# Patient Record
Sex: Female | Born: 1980 | Race: White | Hispanic: No | Marital: Married | Smoking: Never smoker
Health system: Southern US, Community
[De-identification: ages and names within clinical notes are randomized; demographics above are authoritative.]

## PROBLEM LIST (undated history)

## (undated) DIAGNOSIS — G43909 Migraine, unspecified, not intractable, without status migrainosus: Secondary | ICD-10-CM

## (undated) HISTORY — PX: WISDOM TOOTH EXTRACTION: SHX21

## (undated) HISTORY — PX: UPPER GI ENDOSCOPY: SHX6162

## (undated) HISTORY — PX: TONSILLECTOMY AND ADENOIDECTOMY: SHX28

## (undated) HISTORY — PX: DILATION AND CURETTAGE OF UTERUS: SHX78

## (undated) HISTORY — PX: TONSILLECTOMY: SUR1361

## (undated) HISTORY — PX: LASIK: SHX215

## (undated) HISTORY — PX: OTHER SURGICAL HISTORY: SHX169

---

## 2016-12-27 LAB — OB RESULTS CONSOLE RUBELLA ANTIBODY, IGM: Rubella: IMMUNE

## 2016-12-27 LAB — OB RESULTS CONSOLE RPR: RPR: NONREACTIVE

## 2016-12-27 LAB — OB RESULTS CONSOLE GC/CHLAMYDIA
Chlamydia: NEGATIVE
Gonorrhea: NEGATIVE

## 2016-12-27 LAB — OB RESULTS CONSOLE HEPATITIS B SURFACE ANTIGEN: HEP B S AG: NEGATIVE

## 2016-12-27 LAB — OB RESULTS CONSOLE HIV ANTIBODY (ROUTINE TESTING): HIV: NONREACTIVE

## 2016-12-27 LAB — OB RESULTS CONSOLE ABO/RH: RH TYPE: POSITIVE

## 2016-12-27 LAB — OB RESULTS CONSOLE ANTIBODY SCREEN: ANTIBODY SCREEN: NEGATIVE

## 2017-02-23 ENCOUNTER — Other Ambulatory Visit (HOSPITAL_COMMUNITY): Payer: Self-pay | Admitting: Obstetrics and Gynecology

## 2017-02-23 DIAGNOSIS — Z3A23 23 weeks gestation of pregnancy: Secondary | ICD-10-CM

## 2017-02-23 DIAGNOSIS — O09522 Supervision of elderly multigravida, second trimester: Secondary | ICD-10-CM

## 2017-03-01 ENCOUNTER — Encounter (HOSPITAL_COMMUNITY): Payer: Self-pay | Admitting: *Deleted

## 2017-03-03 ENCOUNTER — Ambulatory Visit (HOSPITAL_COMMUNITY)
Admission: RE | Admit: 2017-03-03 | Discharge: 2017-03-03 | Disposition: A | Discharge status: Home / Self Care | Payer: PRIVATE HEALTH INSURANCE | Source: Ambulatory Visit | Attending: Obstetrics and Gynecology | Admitting: Obstetrics and Gynecology

## 2017-03-03 ENCOUNTER — Encounter (HOSPITAL_COMMUNITY): Payer: Self-pay

## 2017-03-03 ENCOUNTER — Other Ambulatory Visit (HOSPITAL_COMMUNITY): Payer: Self-pay | Admitting: Obstetrics and Gynecology

## 2017-03-03 DIAGNOSIS — Z3A22 22 weeks gestation of pregnancy: Secondary | ICD-10-CM | POA: Diagnosis not present

## 2017-03-03 DIAGNOSIS — O35EXX0 Maternal care for other (suspected) fetal abnormality and damage, fetal genitourinary anomalies, not applicable or unspecified: Secondary | ICD-10-CM

## 2017-03-03 DIAGNOSIS — O09522 Supervision of elderly multigravida, second trimester: Secondary | ICD-10-CM

## 2017-03-03 DIAGNOSIS — Z369 Encounter for antenatal screening, unspecified: Secondary | ICD-10-CM

## 2017-03-03 DIAGNOSIS — Z363 Encounter for antenatal screening for malformations: Secondary | ICD-10-CM | POA: Insufficient documentation

## 2017-03-03 DIAGNOSIS — O358XX0 Maternal care for other (suspected) fetal abnormality and damage, not applicable or unspecified: Secondary | ICD-10-CM

## 2017-03-03 DIAGNOSIS — O09512 Supervision of elderly primigravida, second trimester: Secondary | ICD-10-CM | POA: Insufficient documentation

## 2017-03-03 DIAGNOSIS — Z3A23 23 weeks gestation of pregnancy: Secondary | ICD-10-CM

## 2017-03-03 DIAGNOSIS — O283 Abnormal ultrasonic finding on antenatal screening of mother: Secondary | ICD-10-CM | POA: Diagnosis not present

## 2017-03-03 HISTORY — DX: Migraine, unspecified, not intractable, without status migrainosus: G43.909

## 2017-03-06 ENCOUNTER — Other Ambulatory Visit (HOSPITAL_COMMUNITY): Payer: Self-pay | Admitting: *Deleted

## 2017-03-06 DIAGNOSIS — Q639 Congenital malformation of kidney, unspecified: Secondary | ICD-10-CM

## 2017-03-07 ENCOUNTER — Encounter (HOSPITAL_COMMUNITY): Payer: Self-pay

## 2017-03-08 ENCOUNTER — Encounter (HOSPITAL_COMMUNITY): Payer: Self-pay

## 2017-03-30 ENCOUNTER — Encounter (HOSPITAL_COMMUNITY): Payer: Self-pay

## 2017-03-30 ENCOUNTER — Other Ambulatory Visit (HOSPITAL_COMMUNITY): Payer: Self-pay | Admitting: *Deleted

## 2017-03-30 ENCOUNTER — Ambulatory Visit (HOSPITAL_COMMUNITY)
Admission: RE | Admit: 2017-03-30 | Discharge: 2017-03-30 | Disposition: A | Discharge status: Home / Self Care | Payer: PRIVATE HEALTH INSURANCE | Source: Ambulatory Visit | Attending: Obstetrics and Gynecology | Admitting: Obstetrics and Gynecology

## 2017-03-30 ENCOUNTER — Other Ambulatory Visit (HOSPITAL_COMMUNITY): Payer: Self-pay | Admitting: Obstetrics and Gynecology

## 2017-03-30 DIAGNOSIS — Z3A26 26 weeks gestation of pregnancy: Secondary | ICD-10-CM | POA: Insufficient documentation

## 2017-03-30 DIAGNOSIS — O358XX0 Maternal care for other (suspected) fetal abnormality and damage, not applicable or unspecified: Secondary | ICD-10-CM

## 2017-03-30 DIAGNOSIS — O09522 Supervision of elderly multigravida, second trimester: Secondary | ICD-10-CM

## 2017-03-30 DIAGNOSIS — Q639 Congenital malformation of kidney, unspecified: Secondary | ICD-10-CM

## 2017-03-30 DIAGNOSIS — O35EXX0 Maternal care for other (suspected) fetal abnormality and damage, fetal genitourinary anomalies, not applicable or unspecified: Secondary | ICD-10-CM

## 2017-03-30 DIAGNOSIS — O09512 Supervision of elderly primigravida, second trimester: Secondary | ICD-10-CM | POA: Insufficient documentation

## 2017-03-30 DIAGNOSIS — O283 Abnormal ultrasonic finding on antenatal screening of mother: Secondary | ICD-10-CM | POA: Insufficient documentation

## 2017-03-30 NOTE — Addendum Note (Signed)
Encounter addended by: Lenise ArenaBazemore, Shakeera Rightmyer, RDMS on: 03/30/2017  9:42 AM<BR>    Actions taken: Imaging Exam ended

## 2017-03-31 ENCOUNTER — Ambulatory Visit (HOSPITAL_COMMUNITY): Payer: PRIVATE HEALTH INSURANCE

## 2017-04-26 ENCOUNTER — Ambulatory Visit (HOSPITAL_COMMUNITY): Payer: PRIVATE HEALTH INSURANCE

## 2017-05-01 ENCOUNTER — Ambulatory Visit (HOSPITAL_COMMUNITY)
Admission: RE | Admit: 2017-05-01 | Discharge: 2017-05-01 | Disposition: A | Discharge status: Home / Self Care | Payer: PRIVATE HEALTH INSURANCE | Source: Ambulatory Visit | Attending: Obstetrics and Gynecology | Admitting: Obstetrics and Gynecology

## 2017-05-01 ENCOUNTER — Encounter (HOSPITAL_COMMUNITY): Payer: Self-pay

## 2017-05-01 ENCOUNTER — Other Ambulatory Visit (HOSPITAL_COMMUNITY): Payer: Self-pay | Admitting: Obstetrics and Gynecology

## 2017-05-01 DIAGNOSIS — O35EXX0 Maternal care for other (suspected) fetal abnormality and damage, fetal genitourinary anomalies, not applicable or unspecified: Secondary | ICD-10-CM

## 2017-05-01 DIAGNOSIS — O9989 Other specified diseases and conditions complicating pregnancy, childbirth and the puerperium: Secondary | ICD-10-CM | POA: Insufficient documentation

## 2017-05-01 DIAGNOSIS — O09513 Supervision of elderly primigravida, third trimester: Secondary | ICD-10-CM | POA: Insufficient documentation

## 2017-05-01 DIAGNOSIS — Z3A30 30 weeks gestation of pregnancy: Secondary | ICD-10-CM | POA: Diagnosis not present

## 2017-05-01 DIAGNOSIS — O283 Abnormal ultrasonic finding on antenatal screening of mother: Secondary | ICD-10-CM | POA: Diagnosis not present

## 2017-05-01 DIAGNOSIS — O358XX0 Maternal care for other (suspected) fetal abnormality and damage, not applicable or unspecified: Secondary | ICD-10-CM

## 2017-05-01 DIAGNOSIS — N1330 Unspecified hydronephrosis: Secondary | ICD-10-CM | POA: Insufficient documentation

## 2017-05-15 ENCOUNTER — Other Ambulatory Visit (HOSPITAL_COMMUNITY): Payer: Self-pay | Admitting: *Deleted

## 2017-05-15 DIAGNOSIS — O359XX0 Maternal care for (suspected) fetal abnormality and damage, unspecified, not applicable or unspecified: Secondary | ICD-10-CM

## 2017-05-29 ENCOUNTER — Ambulatory Visit (HOSPITAL_COMMUNITY): Payer: PRIVATE HEALTH INSURANCE

## 2017-06-01 ENCOUNTER — Encounter (HOSPITAL_COMMUNITY): Payer: Self-pay

## 2017-06-01 ENCOUNTER — Ambulatory Visit (HOSPITAL_COMMUNITY)
Admission: RE | Admit: 2017-06-01 | Discharge: 2017-06-01 | Disposition: A | Discharge status: Home / Self Care | Payer: PRIVATE HEALTH INSURANCE | Source: Ambulatory Visit | Attending: Obstetrics and Gynecology | Admitting: Obstetrics and Gynecology

## 2017-06-01 DIAGNOSIS — O359XX0 Maternal care for (suspected) fetal abnormality and damage, unspecified, not applicable or unspecified: Secondary | ICD-10-CM | POA: Diagnosis present

## 2017-06-01 DIAGNOSIS — Z3A35 35 weeks gestation of pregnancy: Secondary | ICD-10-CM | POA: Insufficient documentation

## 2017-06-02 LAB — OB RESULTS CONSOLE GBS: STREP GROUP B AG: NEGATIVE

## 2017-06-13 NOTE — L&D Delivery Note (Signed)
Operative Delivery Note At 8:33 AM a viable and healthy female was delivered via Vaginal, Vacuum Investment banker, operational(Extractor).  Presentation: vertex; Position: Left,, Occiput,, Anterior; Station: +3.  Verbal consent: obtained from patient. Indicated due to prolonged fetal bradycardia. Risks and benefits discussed in detail.  Risks include, but are not limited to the risks of anesthesia, bleeding, infection, damage to maternal tissues, fetal cephalhematoma.  There is also the risk of inability to effect vaginal delivery of the head, or shoulder dystocia that cannot be resolved by established maneuvers, leading to the need for emergency cesarean section.  APGAR: 8, 9; weight 7 lb 1.2 oz (3209 g).   Placenta status: spontaneous, intact.   Cord: spontaneous with the following complications: none.  Cord pH: na  Anesthesia:  local Instruments: Kiwi x 3 pulls, 2 pop offs Episiotomy: Right Mediolateral Lacerations: 3rd degree Suture Repair: 2.0 3.0 vicryl rapide and 0 vicryl Est. Blood Loss (mL): 300  Mom to postpartum.  Baby to Couplet care / Skin to Skin.  Nadiyah Zeis J 07/06/2017, 1:06 PM

## 2017-07-05 ENCOUNTER — Encounter (HOSPITAL_COMMUNITY): Payer: Self-pay | Admitting: *Deleted

## 2017-07-05 ENCOUNTER — Telehealth (HOSPITAL_COMMUNITY): Payer: Self-pay | Admitting: *Deleted

## 2017-07-05 ENCOUNTER — Other Ambulatory Visit: Payer: Self-pay | Admitting: Obstetrics and Gynecology

## 2017-07-05 NOTE — Telephone Encounter (Signed)
Preadmission screen  

## 2017-07-06 ENCOUNTER — Other Ambulatory Visit: Payer: Self-pay

## 2017-07-06 ENCOUNTER — Encounter (HOSPITAL_COMMUNITY): Payer: Self-pay

## 2017-07-06 ENCOUNTER — Inpatient Hospital Stay (HOSPITAL_COMMUNITY)
Admission: AD | Admit: 2017-07-06 | Discharge: 2017-07-08 | DRG: 768 | Disposition: A | Discharge status: Home / Self Care | Payer: PRIVATE HEALTH INSURANCE | Source: Ambulatory Visit | Attending: Obstetrics and Gynecology | Admitting: Obstetrics and Gynecology

## 2017-07-06 DIAGNOSIS — Z3A40 40 weeks gestation of pregnancy: Secondary | ICD-10-CM

## 2017-07-06 DIAGNOSIS — O2243 Hemorrhoids in pregnancy, third trimester: Secondary | ICD-10-CM | POA: Diagnosis present

## 2017-07-06 DIAGNOSIS — Z3483 Encounter for supervision of other normal pregnancy, third trimester: Secondary | ICD-10-CM | POA: Diagnosis present

## 2017-07-06 DIAGNOSIS — Z880 Allergy status to penicillin: Secondary | ICD-10-CM | POA: Diagnosis not present

## 2017-07-06 DIAGNOSIS — O358XX0 Maternal care for other (suspected) fetal abnormality and damage, not applicable or unspecified: Secondary | ICD-10-CM | POA: Diagnosis present

## 2017-07-06 DIAGNOSIS — Z8759 Personal history of other complications of pregnancy, childbirth and the puerperium: Secondary | ICD-10-CM

## 2017-07-06 LAB — CBC
HCT: 45 % (ref 36.0–46.0)
Hemoglobin: 15.7 g/dL — ABNORMAL HIGH (ref 12.0–15.0)
MCH: 32.7 pg (ref 26.0–34.0)
MCHC: 34.9 g/dL (ref 30.0–36.0)
MCV: 93.8 fL (ref 78.0–100.0)
PLATELETS: 190 10*3/uL (ref 150–400)
RBC: 4.8 MIL/uL (ref 3.87–5.11)
RDW: 14 % (ref 11.5–15.5)
WBC: 13.5 10*3/uL — ABNORMAL HIGH (ref 4.0–10.5)

## 2017-07-06 LAB — TYPE AND SCREEN
ABO/RH(D): O POS
ANTIBODY SCREEN: NEGATIVE

## 2017-07-06 LAB — ABO/RH: ABO/RH(D): O POS

## 2017-07-06 MED ORDER — OXYTOCIN 40 UNITS IN LACTATED RINGERS INFUSION - SIMPLE MED
INTRAVENOUS | Status: AC
  Filled 2017-07-06: qty 1000

## 2017-07-06 MED ORDER — OXYCODONE HCL 5 MG PO TABS: 5.0000 mg | ORAL_TABLET | ORAL | Status: DC | PRN

## 2017-07-06 MED ORDER — OXYCODONE-ACETAMINOPHEN 5-325 MG PO TABS: 1.0000 | ORAL_TABLET | ORAL | Status: DC | PRN

## 2017-07-06 MED ORDER — ACETAMINOPHEN 325 MG PO TABS
650.0000 mg | ORAL_TABLET | ORAL | Status: DC | PRN
  Administered 2017-07-06: 650 mg via ORAL
  Filled 2017-07-06: qty 2

## 2017-07-06 MED ORDER — LACTATED RINGERS IV SOLN
INTRAVENOUS | Status: DC
  Administered 2017-07-06: 08:00:00 via INTRAVENOUS

## 2017-07-06 MED ORDER — LACTATED RINGERS IV SOLN: 500.0000 mL | INTRAVENOUS | Status: DC | PRN

## 2017-07-06 MED ORDER — DIBUCAINE 1 % RE OINT: 1.0000 "application " | TOPICAL_OINTMENT | RECTAL | Status: DC | PRN

## 2017-07-06 MED ORDER — LIDOCAINE HCL (PF) 1 % IJ SOLN
30.0000 mL | INTRAMUSCULAR | Status: AC | PRN
  Administered 2017-07-06: 30 mL via SUBCUTANEOUS
  Filled 2017-07-06: qty 30

## 2017-07-06 MED ORDER — METHYLERGONOVINE MALEATE 0.2 MG/ML IJ SOLN
0.2000 mg | Freq: Once | INTRAMUSCULAR | Status: AC
  Administered 2017-07-06: 0.2 mg via INTRAMUSCULAR

## 2017-07-06 MED ORDER — FLEET ENEMA 7-19 GM/118ML RE ENEM: 1.0000 | ENEMA | RECTAL | Status: DC | PRN

## 2017-07-06 MED ORDER — OXYTOCIN 40 UNITS IN LACTATED RINGERS INFUSION - SIMPLE MED
2.5000 [IU]/h | INTRAVENOUS | Status: DC
  Administered 2017-07-06: 2.5 [IU]/h via INTRAVENOUS

## 2017-07-06 MED ORDER — METHYLERGONOVINE MALEATE 0.2 MG PO TABS: 0.2000 mg | ORAL_TABLET | ORAL | Status: DC | PRN

## 2017-07-06 MED ORDER — SOD CITRATE-CITRIC ACID 500-334 MG/5ML PO SOLN: 30.0000 mL | ORAL | Status: DC | PRN

## 2017-07-06 MED ORDER — METHYLERGONOVINE MALEATE 0.2 MG/ML IJ SOLN: 0.2000 mg | INTRAMUSCULAR | Status: DC | PRN

## 2017-07-06 MED ORDER — WITCH HAZEL-GLYCERIN EX PADS: 1.0000 "application " | MEDICATED_PAD | CUTANEOUS | Status: DC | PRN

## 2017-07-06 MED ORDER — LIDOCAINE HCL (PF) 1 % IJ SOLN
INTRAMUSCULAR | Status: AC
  Filled 2017-07-06: qty 30

## 2017-07-06 MED ORDER — SENNOSIDES-DOCUSATE SODIUM 8.6-50 MG PO TABS
2.0000 | ORAL_TABLET | ORAL | Status: DC
  Administered 2017-07-07 (×2): 2 via ORAL
  Filled 2017-07-06 (×2): qty 2

## 2017-07-06 MED ORDER — HYDROCORTISONE ACE-PRAMOXINE 1-1 % RE FOAM
1.0000 | Freq: Two times a day (BID) | RECTAL | Status: DC
  Administered 2017-07-06 – 2017-07-08 (×5): 1 via RECTAL
  Filled 2017-07-06 (×2): qty 10

## 2017-07-06 MED ORDER — ONDANSETRON HCL 4 MG/2ML IJ SOLN: 4.0000 mg | Freq: Four times a day (QID) | INTRAMUSCULAR | Status: DC | PRN

## 2017-07-06 MED ORDER — SIMETHICONE 80 MG PO CHEW: 80.0000 mg | CHEWABLE_TABLET | ORAL | Status: DC | PRN

## 2017-07-06 MED ORDER — OXYCODONE-ACETAMINOPHEN 5-325 MG PO TABS: 2.0000 | ORAL_TABLET | ORAL | Status: DC | PRN

## 2017-07-06 MED ORDER — ACETAMINOPHEN 325 MG PO TABS: 650.0000 mg | ORAL_TABLET | ORAL | Status: DC | PRN

## 2017-07-06 MED ORDER — COCONUT OIL OIL: 1.0000 "application " | TOPICAL_OIL | Status: DC | PRN

## 2017-07-06 MED ORDER — BENZOCAINE-MENTHOL 20-0.5 % EX AERO
1.0000 "application " | INHALATION_SPRAY | CUTANEOUS | Status: DC | PRN
  Administered 2017-07-06: 1 via TOPICAL
  Filled 2017-07-06: qty 56

## 2017-07-06 MED ORDER — MAGNESIUM OXIDE 400 (241.3 MG) MG PO TABS
400.0000 mg | ORAL_TABLET | Freq: Every day | ORAL | Status: DC
  Administered 2017-07-06 – 2017-07-08 (×3): 400 mg via ORAL
  Filled 2017-07-06 (×3): qty 1

## 2017-07-06 MED ORDER — DOCUSATE SODIUM 100 MG PO CAPS
100.0000 mg | ORAL_CAPSULE | Freq: Two times a day (BID) | ORAL | Status: DC
  Administered 2017-07-06 – 2017-07-08 (×5): 100 mg via ORAL
  Filled 2017-07-06 (×5): qty 1

## 2017-07-06 MED ORDER — IBUPROFEN 600 MG PO TABS
600.0000 mg | ORAL_TABLET | Freq: Four times a day (QID) | ORAL | Status: DC
  Administered 2017-07-06 – 2017-07-08 (×9): 600 mg via ORAL
  Filled 2017-07-06 (×9): qty 1

## 2017-07-06 MED ORDER — OXYTOCIN BOLUS FROM INFUSION
500.0000 mL | Freq: Once | INTRAVENOUS | Status: AC
  Administered 2017-07-06: 500 mL via INTRAVENOUS

## 2017-07-06 NOTE — H&P (Signed)
Connie Lopez is a 37 y.o. female presenting for labor. OB History    Gravida Para Term Preterm AB Living   2 1 1   1 1    SAB TAB Ectopic Multiple Live Births   1     0 1     Past Medical History:  Diagnosis Date  . Migraines    Past Surgical History:  Procedure Laterality Date  . LASIK    . Lower GI    . TONSILLECTOMY    . TONSILLECTOMY AND ADENOIDECTOMY    . UPPER GI ENDOSCOPY    . WISDOM TOOTH EXTRACTION     Family History: family history includes Colon cancer in her maternal grandmother; Hypertension in her maternal grandfather and maternal grandmother; Lung cancer in her paternal grandfather. Social History:  reports that  has never smoked. she has never used smokeless tobacco. She reports that she does not drink alcohol or use drugs.     Maternal Diabetes: No Genetic Screening: Normal Maternal Ultrasounds/Referrals: Abnormal:  Findings:   Fetal renal pyelectasis Fetal Ultrasounds or other Referrals:  Referred to Materal Fetal Medicine  Maternal Substance Abuse:  No Significant Maternal Medications:  None Significant Maternal Lab Results:  None Other Comments:  peds urology for fu  Review of Systems  Constitutional: Negative.   All other systems reviewed and are negative.  Maternal Medical History:  Reason for admission: Contractions.   Contractions: Onset was 3-5 hours ago.   Frequency: regular.   Perceived severity is moderate.    Fetal activity: Perceived fetal activity is normal.   Last perceived fetal movement was within the past hour.    Prenatal complications: no prenatal complications Prenatal Complications - Diabetes: none.    Dilation: 10 Station: +2, +3 Exam by:: Leeasia Secrist Blood pressure 137/67, pulse 91, temperature 98 F (36.7 C), temperature source Oral, resp. rate 16, height 5\' 7"  (1.702 m), weight 66.7 kg (147 lb), last menstrual period 09/16/2016, SpO2 99 %, unknown if currently breastfeeding. Maternal Exam:  Uterine Assessment: Contraction  strength is firm.  Contraction frequency is regular.   Abdomen: Patient reports no abdominal tenderness. Fetal presentation: vertex  Introitus: Normal vulva. Normal vagina.  Ferning test: not done.  Nitrazine test: not done. Amniotic fluid character: not assessed.  Cervix: Cervix evaluated by digital exam.     Physical Exam  Nursing note and vitals reviewed. Constitutional: She is oriented to person, place, and time. She appears well-developed and well-nourished.  HENT:  Head: Normocephalic.  Neck: Normal range of motion. Neck supple.  Cardiovascular: Normal rate and regular rhythm.  Respiratory: Effort normal and breath sounds normal.  GI: Bowel sounds are normal.  Genitourinary: Vagina normal and uterus normal.  Musculoskeletal: Normal range of motion.  Neurological: She is alert and oriented to person, place, and time. She has normal reflexes.  Skin: Skin is warm and dry.  Psychiatric: She has a normal mood and affect.    Prenatal labs: ABO, Rh: --/--/O POS (01/24 0757) Antibody: NEG (01/24 0757) Rubella: Immune (07/17 0000) RPR: Nonreactive (07/17 0000)  HBsAg: Negative (07/17 0000)  HIV: Non-reactive (07/17 0000)  GBS: Negative (12/21 0000)   Assessment/Plan: 40 wk iup Active labor Fetal Urinary tract dilatation FU peds pp. Admit   Oshay Stranahan J 07/06/2017, 1:03 PM

## 2017-07-07 LAB — CBC
HCT: 34.8 % — ABNORMAL LOW (ref 36.0–46.0)
Hemoglobin: 11.6 g/dL — ABNORMAL LOW (ref 12.0–15.0)
MCH: 31.8 pg (ref 26.0–34.0)
MCHC: 33.3 g/dL (ref 30.0–36.0)
MCV: 95.3 fL (ref 78.0–100.0)
Platelets: 167 10*3/uL (ref 150–400)
RBC: 3.65 MIL/uL — ABNORMAL LOW (ref 3.87–5.11)
RDW: 14.2 % (ref 11.5–15.5)
WBC: 11.3 10*3/uL — ABNORMAL HIGH (ref 4.0–10.5)

## 2017-07-07 LAB — BIRTH TISSUE RECOVERY COLLECTION (PLACENTA DONATION)

## 2017-07-07 LAB — RPR: RPR Ser Ql: NONREACTIVE

## 2017-07-07 NOTE — Lactation Note (Signed)
This note was copied from a baby's chart. Lactation Consultation Note  Patient Name: Connie Charleston RopesSara Wickwire UJWJX'BToday's Date: 07/07/2017 Reason for consult: Follow-up assessment;Infant weight loss(5% weight loss, )  Baby is 32 hours old  LC reviewed and updated the doc flow sheets per mom  Per mom baby last fed at 4:15 pm for 15 mins  Baby presently being held by mom and baby sleeping.  Per mom breast feeding is going well and improving.  Also encouraged hand expressing between feedings for extra calories.  LC instructed mom on the use of hand pump for D/C tomorrow.  Per mom will have A DEBP Medela at home.  LC discussed nutritive vs non - nutritive feeding patterns and to watch for hanging  Out latched.     Maternal Data    Feeding Feeding Type: (per mom baby last fed at 4:15 p for 15 mins ) Length of feed: 15 min  LATCH Score                   Interventions Interventions: Breast feeding basics reviewed  Lactation Tools Discussed/Used Tools: Pump Breast pump type: Manual WIC Program: No Pump Review: Setup, frequency, and cleaning;Milk Storage Initiated by:: MAI  Date initiated:: 07/07/17   Consult Status Consult Status: Follow-up Date: 07/08/17 Follow-up type: In-patient    Matilde SprangMargaret Ann Quanah Majka 07/07/2017, 5:21 PM

## 2017-07-07 NOTE — Progress Notes (Signed)
Post Partum Day 1 S/P vacuum extraction  Feeding: bottle Subjective: No HA, SOB, CP, F/C, breast symptoms. Normal vaginal bleeding, no clots. Pain controlled.  ambulating without symptoms.  voiding without difficulty. Pt bothered by hemorrhoids  Objective: BP (!) 103/51 (BP Location: Right Arm)   Pulse 76   Temp 97.6 F (36.4 C) (Oral)   Resp 18   Ht 5\' 7"  (1.702 m)   Wt 66.7 kg (147 lb)   LMP 09/16/2016   SpO2 98%   Breastfeeding? Unknown   BMI 23.02 kg/m  I&O reviewed.   Physical Exam:  General: alert and cooperative Lochia: appropriate Uterine Fundus: firm DVT Evaluation: No evidence of DVT seen on physical exam. Ext: No c/c/e Recent Labs    07/06/17 0757 07/07/17 0537  HGB 15.7* 11.6*  HCT 45.0 34.8*      Assessment/Plan: 37 y.o.  PPD #1 .  normal postpartum exam Continue current postpartum care Ambulate Plan anusol script with discharge, recc stool softener   LOS: 1 day   Lendon ColonelKelly A Kiara Keep 07/07/2017 1:39 PM

## 2017-07-07 NOTE — Lactation Note (Signed)
This note was copied from a baby's chart. Lactation Consultation Note Baby 22 hrs old. Baby had been some spitty at times. Has latched well several times. Mom has round breast w/everted nipples. Baby latched well. Assisted in football position. Discussed support and props. Mom has colostrum, hand expression demonstrated.  Newborn behavior discussed, STS, I&O, cluster feeding, supply and demand, pumping, milk storage, and assess breast before and after BF for transfer. Mom encouraged to feed baby 8-12 times/24 hours and with feeding cues.  WH/LC brochure given w/resources, support groups and LC services.  Patient Name: Connie Charleston RopesSara Henne ZOXWR'UToday's Date: 07/07/2017 Reason for consult: Initial assessment   Maternal Data Has patient been taught Hand Expression?: Yes Does the patient have breastfeeding experience prior to this delivery?: No  Feeding Length of feed: 0 min  LATCH Score Latch: Too sleepy or reluctant, no latch achieved, no sucking elicited.  Audible Swallowing: None  Type of Nipple: Everted at rest and after stimulation  Comfort (Breast/Nipple): Soft / non-tender  Hold (Positioning): Full assist, staff holds infant at breast  LATCH Score: 4  Interventions Interventions: Breast feeding basics reviewed;Assisted with latch  Lactation Tools Discussed/Used     Consult Status Consult Status: Follow-up Date: 07/08/17 Follow-up type: In-patient    Murl Zogg, Diamond NickelLAURA G 07/07/2017, 7:00 AM

## 2017-07-08 MED ORDER — BENZOCAINE-MENTHOL 20-0.5 % EX AERO: 1.0000 "application " | INHALATION_SPRAY | CUTANEOUS | Status: DC | PRN

## 2017-07-08 MED ORDER — HYDROCORTISONE 2.5 % RE CREA: TOPICAL_CREAM | Freq: Three times a day (TID) | RECTAL | 1 refills | Status: DC

## 2017-07-08 MED ORDER — COCONUT OIL OIL: 1.0000 "application " | TOPICAL_OIL | 0 refills | Status: DC | PRN

## 2017-07-08 MED ORDER — WITCH HAZEL-GLYCERIN EX PADS: 1.0000 "application " | MEDICATED_PAD | CUTANEOUS | 12 refills | Status: DC | PRN

## 2017-07-08 MED ORDER — IBUPROFEN 600 MG PO TABS: 600.0000 mg | ORAL_TABLET | Freq: Four times a day (QID) | ORAL | 0 refills | Status: DC

## 2017-07-08 MED ORDER — HYDROCORTISONE ACETATE 25 MG RE SUPP
25.0000 mg | Freq: Two times a day (BID) | RECTAL | Status: DC
  Filled 2017-07-08: qty 1

## 2017-07-08 MED ORDER — MAGNESIUM OXIDE 400 (241.3 MG) MG PO TABS: 400.0000 mg | ORAL_TABLET | Freq: Every day | ORAL | Status: DC

## 2017-07-08 NOTE — Discharge Summary (Signed)
Obstetric Discharge Summary Reason for Admission: onset of labor Prenatal Procedures: ultrasound Intrapartum Procedures: vacuum Postpartum Procedures: none Complications-Operative and Postpartum: 3rd degree perineal laceration Hemoglobin  Date Value Ref Range Status  07/07/2017 11.6 (L) 12.0 - 15.0 g/dL Final    Comment:    DELTA CHECK NOTED REPEATED TO VERIFY    HCT  Date Value Ref Range Status  07/07/2017 34.8 (L) 36.0 - 46.0 % Final    Physical Exam:  General: alert, cooperative and no distress Lochia: appropriate Uterine Fundus: firm Incision: healing well, Hemorrhoids DVT Evaluation: No cords or calf tenderness. No significant calf/ankle edema.  Discharge Diagnoses: Term Pregnancy-delivered  Discharge Information: Date: 07/08/2017 Activity: pelvic rest Diet: routine Medications:  Allergies as of 07/08/2017      Reactions   Penicillins Rash   Childhood reaction Has patient had a PCN reaction causing immediate rash, facial/tongue/throat swelling, SOB or lightheadedness with hypotension: Yes Has patient had a PCN reaction causing severe rash involving mucus membranes or skin necrosis: No Has patient had a PCN reaction that required hospitalization: No Has patient had a PCN reaction occurring within the last 10 years: No If all of the above answers are "NO", then may proceed with Cephalosporin use.      Medication List    STOP taking these medications   acyclovir ointment 5 % Commonly known as:  ZOVIRAX     TAKE these medications   benzocaine-Menthol 20-0.5 % Aero Commonly known as:  DERMOPLAST Apply 1 application topically as needed for irritation (perineal discomfort).   coconut oil Oil Apply 1 application topically as needed.   COLACE PO Take 1 tablet by mouth as needed (mild constipation).   hydrocortisone 2.5 % rectal cream Commonly known as:  PROCTOSOL HC Place rectally 3 (three) times daily.   ibuprofen 600 MG tablet Commonly known as:   ADVIL,MOTRIN Take 1 tablet (600 mg total) by mouth every 6 (six) hours.   magnesium oxide 400 (241.3 Mg) MG tablet Commonly known as:  MAG-OX Take 1 tablet (400 mg total) by mouth daily. Start taking on:  07/09/2017   PRENATAL VITAMINS PO Take 1 tablet by mouth daily.   TYLENOL PO Take 500 mg by mouth every 6 (six) hours as needed (pain).   valACYclovir 1000 MG tablet Commonly known as:  VALTREX Take 1 tablet by mouth daily as needed (cold sore).   witch hazel-glycerin pad Commonly known as:  TUCKS Apply 1 application topically as needed for hemorrhoids.            Discharge Care Instructions  (From admission, onward)        Start     Ordered   07/08/17 0000  Discharge wound care:    Comments:  Sitz baths 2 times /day with warm water x 1 week   07/08/17 [id number]     Condition: stable Instructions: refer to practice specific booklet Discharge to: home Follow-up Information    Connie Mackieaavon, Richard, MD. Schedule an appointment as soon as possible for a visit in 2 week(s).   Specialty:  Obstetrics and Gynecology Contact information: [address] [place] [address] [phone]           Newborn Data: Live born female Connie Lopez Birth Weight: 7 lb 1.2 oz (3209 g) APGAR: 8, 9  Newborn Delivery   Birth date/time:  07/06/2017 08:33:00 Delivery type:  Vaginal, Vacuum (Extractor)     Home with mother.  Connie Lopez 07/08/2017, 9:38 AM

## 2017-07-08 NOTE — Progress Notes (Signed)
Post Partum Day #2           Information for the patient's newborn:  Karna DupesDerry, Boy Kennita [[id number]][[id number]]  female     circumcision completed Baby name: Greggory StallionGeorge Feeding: breast  Subjective: No HA, SOB, CP, F/C, breast symptoms. Pain from perineum / hemorrhoids. Normal vaginal bleeding, no clots.      Objective:  VS:  Vitals:   07/06/17 1620 07/06/17 2100 07/07/17 0525 07/07/17 1823  BP: 117/67 119/66 (!) 103/51 98/61  Pulse: 85 85 76 80  Resp: 18 16 18 20   Temp: 98 F (36.7 C) 97.9 F (36.6 C) 97.6 F (36.4 C) 98.1 F (36.7 C)  TempSrc: Oral Oral Oral Oral  SpO2:  98%    Weight:      Height:        No intake or output data in the 24 hours ending 07/08/17 0850    Recent Labs    07/06/17 0757 07/07/17 0537  WBC 13.5* 11.3*  HGB 15.7* 11.6*  HCT 45.0 34.8*  PLT 190 167    Blood type: --/--/O POS, O POS (01/24 0757) Rubella: Immune (07/17 0000)    Physical Exam:  General: alert, cooperative and no distress Uterine Fundus: firm Lochia: appropriate Perineum: repair intact, edema mild, large cluster h-rrhoids at anal verge, inflamed, non-thrombosed DVT Evaluation: No cords or calf tenderness.    Assessment/Plan: PPD # 2 / 37 y.o., G2P1011 S/P:vacuum extraction   Principal Problem:   Postpartum care following VAVD (1/24) Active Problems:   Normal labor and delivery   Status post vacuum-assisted vaginal delivery   Third degree laceration of perineum during delivery, postpartum    normal postpartum exam  Bowel regimen, sitz baths BID  Anusol HC supp. bid  Continue current postpartum care             DC home today w/ instructions  [facility] and PRN   LOS: 2 days   Neta Mendsaniela C Narcissa Melder, CNM, MSN 07/08/2017, 8:50 AM

## 2017-07-08 NOTE — Lactation Note (Signed)
This note was copied from a baby's chart. Lactation Consultation Note: Mom reports BF is going well. Baby cluster fed through the night Asking about lumps in the breast- encouraged massage and hand expression. Asking about lanolin- encouraged EBM or coconut oil to nipples instead. Reviewed engorgement prevention and treatment. No further questions at present. Reviewed our phone number, OP appointments and BFSG as resources for support after DC. To call prn  Patient Name: Connie Charleston RopesSara Silas AVWUJ'WToday's Date: 07/08/2017 Reason for consult: Follow-up assessment   Maternal Data Formula Feeding for Exclusion: No Has patient been taught Hand Expression?: Yes Does the patient have breastfeeding experience prior to this delivery?: No  Feeding Feeding Type: Breast Fed Length of feed: 10 min  LATCH Score       Type of Nipple: Everted at rest and after stimulation  Comfort (Breast/Nipple): Filling, red/small blisters or bruises, mild/mod discomfort        Interventions Interventions: Breast feeding basics reviewed;Hand express  Lactation Tools Discussed/Used     Consult Status Consult Status: Complete    Pamelia HoitWeeks, Alfrieda Tarry D 07/08/2017, 9:08 AM

## 2017-07-10 ENCOUNTER — Inpatient Hospital Stay (HOSPITAL_COMMUNITY): Payer: PRIVATE HEALTH INSURANCE

## 2018-06-14 DIAGNOSIS — G44229 Chronic tension-type headache, not intractable: Secondary | ICD-10-CM | POA: Diagnosis not present

## 2018-06-14 DIAGNOSIS — Z1389 Encounter for screening for other disorder: Secondary | ICD-10-CM | POA: Diagnosis not present

## 2018-06-14 DIAGNOSIS — M859 Disorder of bone density and structure, unspecified: Secondary | ICD-10-CM | POA: Diagnosis not present

## 2018-06-14 DIAGNOSIS — G43909 Migraine, unspecified, not intractable, without status migrainosus: Secondary | ICD-10-CM | POA: Diagnosis not present

## 2018-06-14 DIAGNOSIS — B351 Tinea unguium: Secondary | ICD-10-CM | POA: Diagnosis not present

## 2018-06-27 IMAGING — US US MFM OB FOLLOW-UP
1 series · 14 of 28 positions shown · non-contrast
Comparison: none

[Series 1: us mfm ob follow-up · 47 acquisitions, 14 frames shown]
[im 2/47]
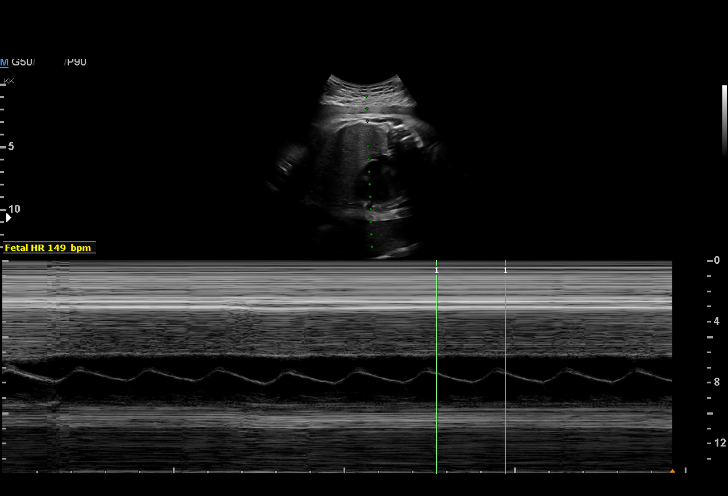
[im 6/47]
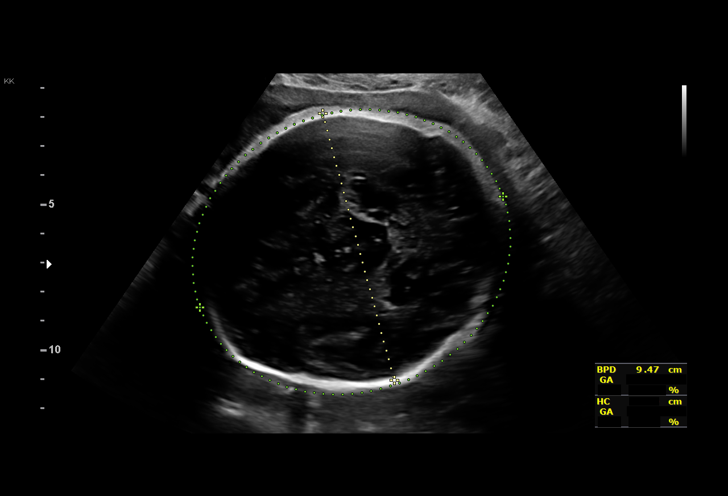
[im 9/47]
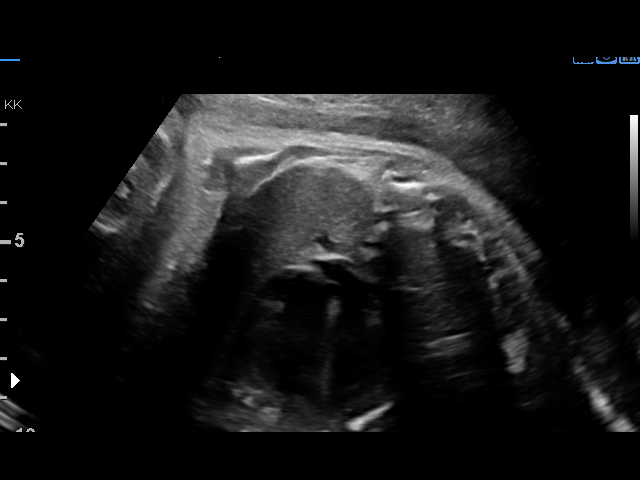
[im 12/47]
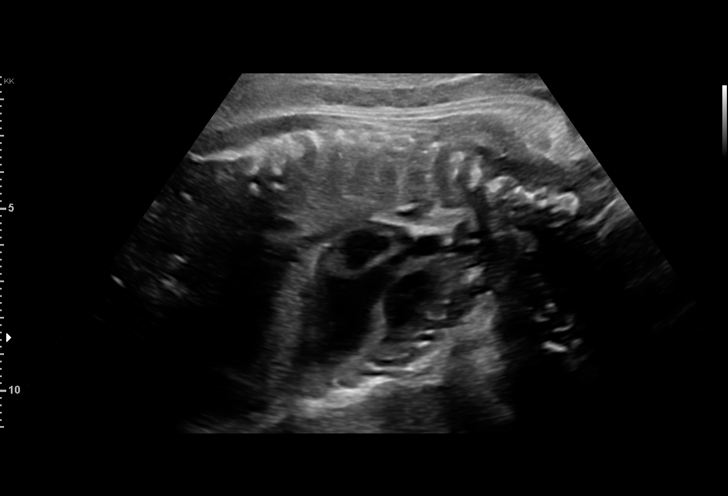
[im 16/47]
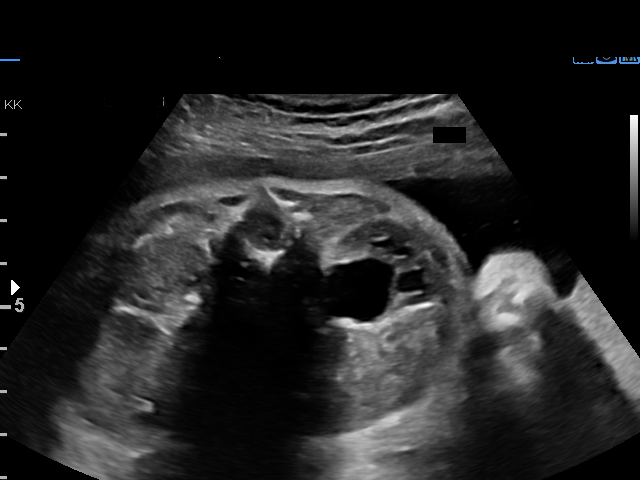
[im 19/47]
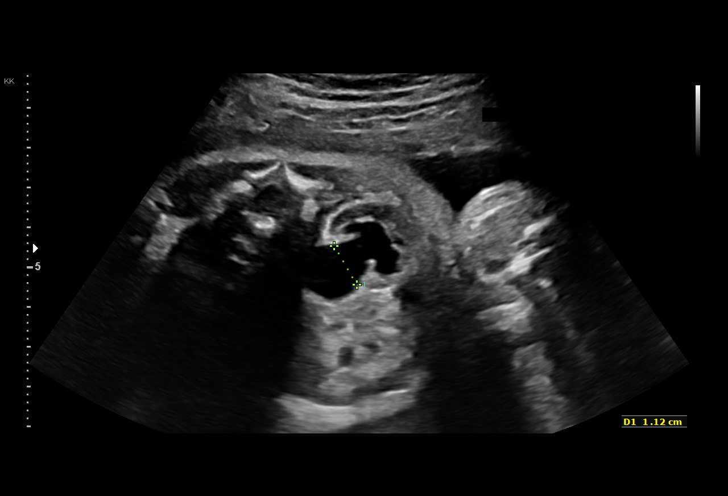
[im 23/47]
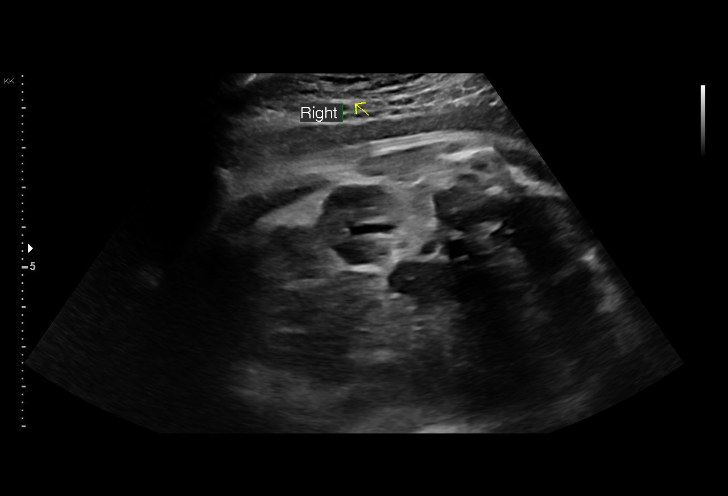
[im 26/47]
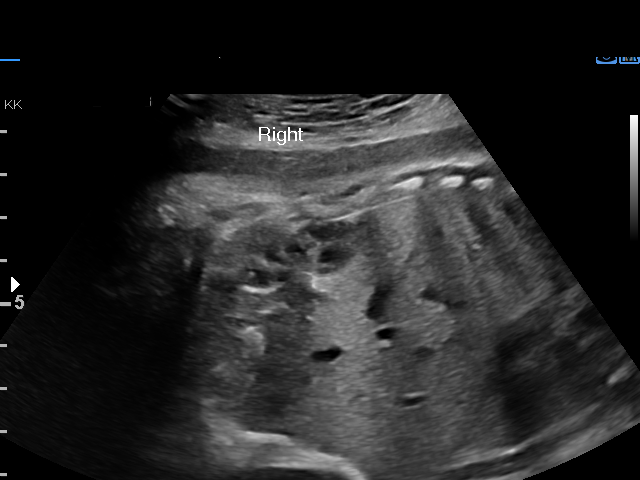
[im 29/47]
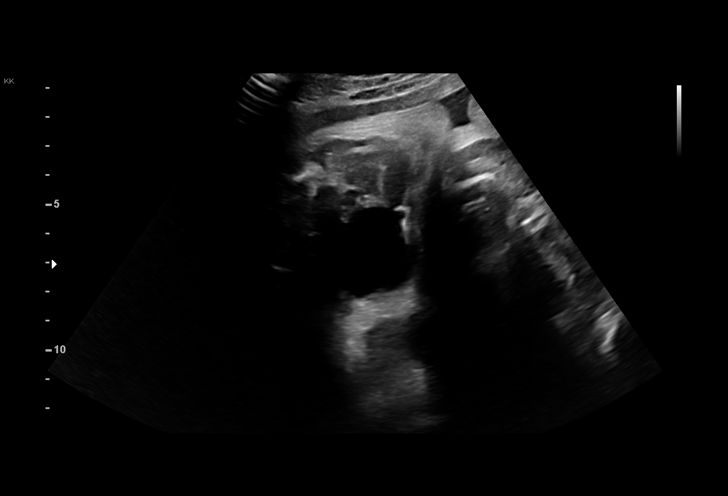
[im 33/47]
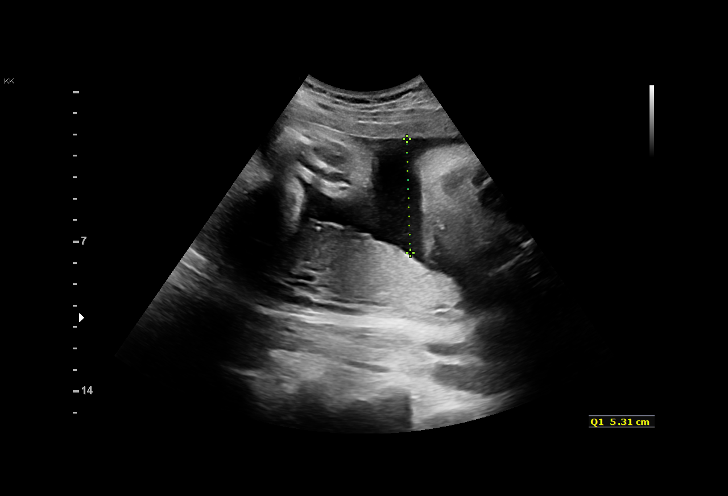
[im 36/47]
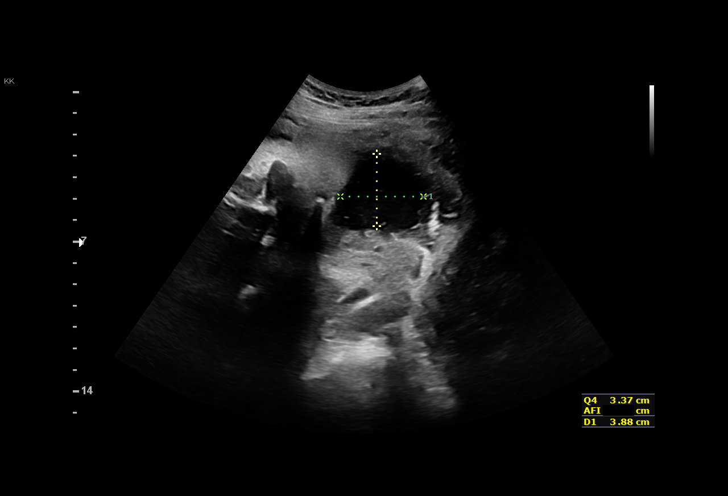
[im 40/47]
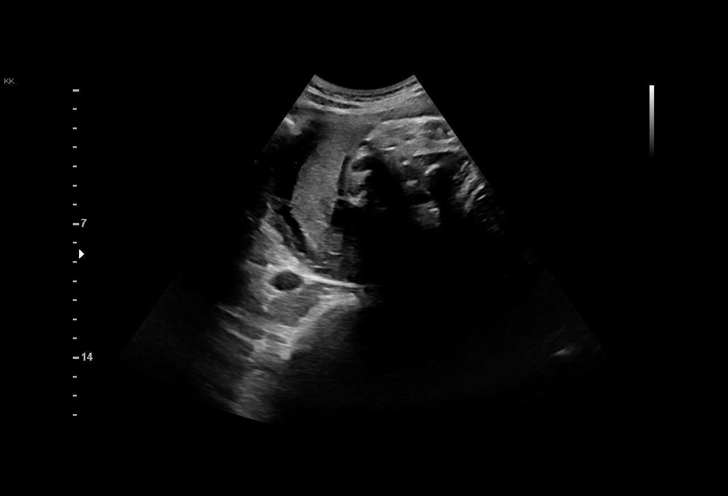
[im 43/47]
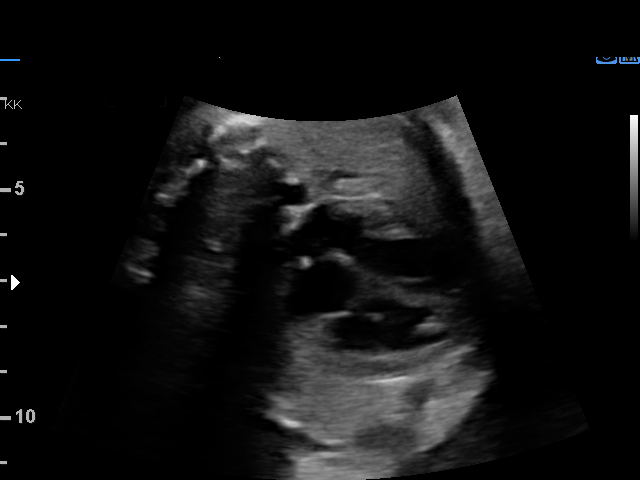
[im 47/47]
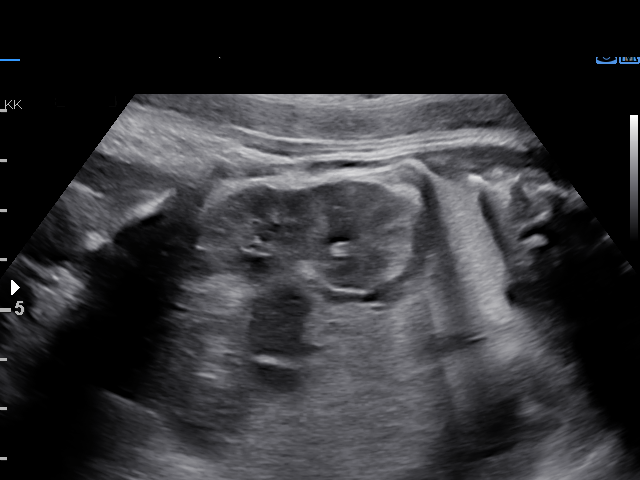

[14 of 28 positions shown; findings below may reference images not displayed]

& Infertility
9855 [REDACTED]

1  BATIST JANE            [id number]      [id number]     [id number]
Indications

35 weeks gestation of pregnancy
Advanced maternal age primigravida (36),
third trimester-low risk NIPS
Fetal renal anomaly, bilat, duplicated
collecting system; obstructed left lower pole;
S/P peds urology consult
OB History

Blood Type:            Height:  5'7"   Weight (lb):  134       BMI:
Gravidity:    1         Term:   0        Prem:   0        SAB:   0
TOP:          0       Ectopic:  0        Living: 0
Fetal Evaluation

Num Of Fetuses:     1
Fetal Heart         149
Rate(bpm):
Cardiac Activity:   Observed
Presentation:       Cephalic
Placenta:           Posterior, above cervical os
P. Cord Insertion:  Previously Visualized

Amniotic Fluid
AFI FV:      Subjectively within normal limits

AFI Sum(cm)     %Tile       Largest Pocket(cm)
17.53           65

RUQ(cm)       RLQ(cm)       LUQ(cm)        LLQ(cm)
5.31
Biometry

BPD:      94.7  mm     G. Age:  38w 4d       > 99  %    CI:        84.37   %    70 - 86
FL/HC:      21.7   %    20.1 -
HC:      324.9  mm     G. Age:  36w 6d         61  %    HC/AC:      1.04        0.93 -
AC:      312.9  mm     G. Age:  35w 1d         63  %    FL/BPD:     74.3   %    71 - 87
FL:       70.4  mm     G. Age:  36w 1d         71  %    FL/AC:      22.5   %    20 - 24

Est. FW:    8647  gm      6 lb 3 oz     79  %
Gestational Age

LMP:           36w 6d        Date:  09/16/16                 EDD:   06/23/17
Clinical EDD:  35w 0d                                        EDD:   07/06/17
U/S Today:     36w 5d                                        EDD:   06/24/17
Best:          35w 0d     Det. By:  Clinical EDD             EDD:   07/06/17
Anatomy

Cranium:               Appears normal         Aortic Arch:            Previously seen
Cavum:                 Previously seen        Ductal Arch:            Previously seen
Ventricles:            Appears normal         Diaphragm:              Previously seen
Choroid Plexus:        Previously seen        Stomach:                Appears normal, left
sided
Cerebellum:            Previously seen        Abdomen:                Appears normal
Posterior Fossa:       Previously seen        Abdominal Wall:         Previously seen
Nuchal Fold:           Not applicable (>20    Cord Vessels:           Previously seen
wks GA)
Face:                  Orbits and profile     Kidneys:                Left sided
previously seen
pyelectasis, 13 mm
Lips:                  Previously seen        Bladder:                Appears normal
Thoracic:              Appears normal         Spine:                  Previously seen
Heart:                 Appears normal         Upper Extremities:      Previously seen
(4CH, axis, and situs
RVOT:                  Previously seen        Lower Extremities:      Previously seen
LVOT:                  Previously seen

Other:  Parents do not wish to know sex of fetus! Heels and Nasal bone
previously visualized.
Cervix Uterus Adnexa

Cervix
Not visualized (advanced GA >71wks)
Impression

SIUP at 35+0 weeks
Bilateral duplicated collecting systems; UTD A2-3 of left, low
pole - renal pelvis = [DATE] cms; no ureterocele or ureter
identified; normal parenchyma
All other interval fetal anatomy was seen and appeared
normal; anatomic survey complete
Normal amniotic fluid volume
Appropriate interval growth with EFW at the 79th %tile
Recommendations

Follow-up as clinically indicated
Routine prenatal care and delivery
Postnatal evaluation of newborn's kidneys; antibiotic
prophylaxis

## 2018-07-02 DIAGNOSIS — M4180 Other forms of scoliosis, site unspecified: Secondary | ICD-10-CM | POA: Diagnosis not present

## 2018-07-02 DIAGNOSIS — M542 Cervicalgia: Secondary | ICD-10-CM | POA: Diagnosis not present

## 2018-07-02 DIAGNOSIS — G43909 Migraine, unspecified, not intractable, without status migrainosus: Secondary | ICD-10-CM | POA: Diagnosis not present

## 2018-07-04 DIAGNOSIS — M542 Cervicalgia: Secondary | ICD-10-CM | POA: Diagnosis not present

## 2018-07-04 DIAGNOSIS — M4180 Other forms of scoliosis, site unspecified: Secondary | ICD-10-CM | POA: Diagnosis not present

## 2018-07-04 DIAGNOSIS — G43909 Migraine, unspecified, not intractable, without status migrainosus: Secondary | ICD-10-CM | POA: Diagnosis not present

## 2018-08-03 DIAGNOSIS — M542 Cervicalgia: Secondary | ICD-10-CM | POA: Diagnosis not present

## 2018-08-03 DIAGNOSIS — M4180 Other forms of scoliosis, site unspecified: Secondary | ICD-10-CM | POA: Diagnosis not present

## 2018-08-03 DIAGNOSIS — G43909 Migraine, unspecified, not intractable, without status migrainosus: Secondary | ICD-10-CM | POA: Diagnosis not present

## 2018-08-22 DIAGNOSIS — M542 Cervicalgia: Secondary | ICD-10-CM | POA: Diagnosis not present

## 2018-08-22 DIAGNOSIS — G43909 Migraine, unspecified, not intractable, without status migrainosus: Secondary | ICD-10-CM | POA: Diagnosis not present

## 2018-08-22 DIAGNOSIS — M4180 Other forms of scoliosis, site unspecified: Secondary | ICD-10-CM | POA: Diagnosis not present

## 2018-08-31 DIAGNOSIS — J029 Acute pharyngitis, unspecified: Secondary | ICD-10-CM | POA: Diagnosis not present

## 2018-09-10 DIAGNOSIS — L298 Other pruritus: Secondary | ICD-10-CM | POA: Diagnosis not present

## 2018-09-10 DIAGNOSIS — B09 Unspecified viral infection characterized by skin and mucous membrane lesions: Secondary | ICD-10-CM | POA: Diagnosis not present

## 2018-09-10 DIAGNOSIS — R21 Rash and other nonspecific skin eruption: Secondary | ICD-10-CM | POA: Diagnosis not present

## 2018-11-27 DIAGNOSIS — Z3200 Encounter for pregnancy test, result unknown: Secondary | ICD-10-CM | POA: Diagnosis not present

## 2018-12-19 DIAGNOSIS — O209 Hemorrhage in early pregnancy, unspecified: Secondary | ICD-10-CM | POA: Diagnosis not present

## 2018-12-19 DIAGNOSIS — Z3A01 Less than 8 weeks gestation of pregnancy: Secondary | ICD-10-CM | POA: Diagnosis not present

## 2018-12-20 DIAGNOSIS — O0289 Other abnormal products of conception: Secondary | ICD-10-CM | POA: Diagnosis not present

## 2018-12-20 DIAGNOSIS — O021 Missed abortion: Secondary | ICD-10-CM | POA: Diagnosis not present

## 2018-12-20 DIAGNOSIS — O039 Complete or unspecified spontaneous abortion without complication: Secondary | ICD-10-CM | POA: Diagnosis not present

## 2018-12-22 ENCOUNTER — Inpatient Hospital Stay (HOSPITAL_COMMUNITY): Payer: BC Managed Care – PPO

## 2018-12-22 ENCOUNTER — Other Ambulatory Visit: Payer: Self-pay

## 2018-12-22 ENCOUNTER — Encounter (HOSPITAL_COMMUNITY): Payer: Self-pay | Admitting: Student

## 2018-12-22 ENCOUNTER — Inpatient Hospital Stay (HOSPITAL_COMMUNITY)
Admission: AD | Admit: 2018-12-22 | Discharge: 2018-12-22 | Disposition: A | Discharge status: Home / Self Care | Payer: BC Managed Care – PPO | Source: Ambulatory Visit | Attending: Obstetrics and Gynecology | Admitting: Obstetrics and Gynecology

## 2018-12-22 DIAGNOSIS — O039 Complete or unspecified spontaneous abortion without complication: Secondary | ICD-10-CM | POA: Insufficient documentation

## 2018-12-22 DIAGNOSIS — Z3A08 8 weeks gestation of pregnancy: Secondary | ICD-10-CM

## 2018-12-22 DIAGNOSIS — O209 Hemorrhage in early pregnancy, unspecified: Secondary | ICD-10-CM | POA: Diagnosis not present

## 2018-12-22 DIAGNOSIS — Z880 Allergy status to penicillin: Secondary | ICD-10-CM | POA: Insufficient documentation

## 2018-12-22 DIAGNOSIS — Z3A00 Weeks of gestation of pregnancy not specified: Secondary | ICD-10-CM | POA: Diagnosis not present

## 2018-12-22 DIAGNOSIS — D259 Leiomyoma of uterus, unspecified: Secondary | ICD-10-CM | POA: Diagnosis not present

## 2018-12-22 LAB — CBC
HCT: 42.9 % (ref 36.0–46.0)
Hemoglobin: 14.6 g/dL (ref 12.0–15.0)
MCH: 31.5 pg (ref 26.0–34.0)
MCHC: 34 g/dL (ref 30.0–36.0)
MCV: 92.5 fL (ref 80.0–100.0)
Platelets: 205 10*3/uL (ref 150–400)
RBC: 4.64 MIL/uL (ref 3.87–5.11)
RDW: 12.7 % (ref 11.5–15.5)
WBC: 5.1 10*3/uL (ref 4.0–10.5)
nRBC: 0 % (ref 0.0–0.2)

## 2018-12-22 LAB — HCG, QUANTITATIVE, PREGNANCY: hCG, Beta Chain, Quant, S: 2261 m[IU]/mL — ABNORMAL HIGH (ref <5)

## 2018-12-22 MED ORDER — MISOPROSTOL 200 MCG PO TABS: 800.0000 ug | ORAL_TABLET | Freq: Once | ORAL | 0 refills | Status: DC

## 2018-12-22 NOTE — Discharge Instructions (Signed)
Miscarriage °A miscarriage is the loss of an unborn baby (fetus) before the 20th week of pregnancy. Most miscarriages happen during the first 3 months of pregnancy. Sometimes, a miscarriage can happen before a woman knows that she is pregnant. °Having a miscarriage can be an emotional experience. If you have had a miscarriage, talk with your health care provider about any questions you may have about miscarrying, the grieving process, and your plans for future pregnancy. °What are the causes? °A miscarriage may be caused by: °· Problems with the genes or chromosomes of the fetus. These problems make it impossible for the baby to develop normally. They are often the result of random errors that occur early in the development of the baby, and are not passed from parent to child (not inherited). °· Infection of the cervix or uterus. °· Conditions that affect hormone balance in the body. °· Problems with the cervix, such as the cervix opening and thinning before pregnancy is at term (cervical insufficiency). °· Problems with the uterus. These may include: °? A uterus with an abnormal shape. °? Fibroids in the uterus. °? Congenital abnormalities. These are problems that were present at birth. °· Certain medical conditions. °· Smoking, drinking alcohol, or using drugs. °· Injury (trauma). °In many cases, the cause of a miscarriage is not known. °What are the signs or symptoms? °Symptoms of this condition include: °· Vaginal bleeding or spotting, with or without cramps or pain. °· Pain or cramping in the abdomen or lower back. °· Passing fluid, tissue, or blood clots from the vagina. °How is this diagnosed? °This condition may be diagnosed based on: °· A physical exam. °· Ultrasound. °· Blood tests. °· Urine tests. °How is this treated? °Treatment for a miscarriage is sometimes not necessary if you naturally pass all the tissue that was in your uterus. If necessary, this condition may be treated with: °· Dilation and  curettage (D&C). This is a procedure in which the cervix is stretched open and the lining of the uterus (endometrium) is scraped. This is done only if tissue from the fetus or placenta remains in the body (incomplete miscarriage). °· Medicines, such as: °? Antibiotic medicine, to treat infection. °? Medicine to help the body pass any remaining tissue. °? Medicine to reduce (contract) the size of the uterus. These medicines may be given if you have a lot of bleeding. °If you have Rh negative blood and your baby was Rh positive, you will need a shot of a medicine called Rh immunoglobulinto protect your future babies from Rh blood problems. "Rh-negative" and "Rh-positive" refer to whether or not the blood has a specific protein found on the surface of red blood cells (Rh factor). °Follow these instructions at home: °Medicines ° °· Take over-the-counter and prescription medicines only as told by your health care provider. °· If you were prescribed antibiotic medicine, take it as told by your health care provider. Do not stop taking the antibiotic even if you start to feel better. °· Do not take NSAIDs, such as aspirin and ibuprofen, unless they are approved by your health care provider. These medicines can cause bleeding. °Activity °· Rest as directed. Ask your health care provider what activities are safe for you. °· Have someone help with home and family responsibilities during this time. °General instructions °· Keep track of the number of sanitary pads you use each day and how soaked (saturated) they are. Write down this information. °· Monitor the amount of tissue or blood clots that   you pass from your vagina. Save any large amounts of tissue for your health care provider to examine. °· Do not use tampons, douche, or have sex until your health care provider approves. °· To help you and your partner with the process of grieving, talk with your health care provider or seek counseling. °· When you are ready, meet with  your health care provider to discuss any important steps you should take for your health. Also, discuss steps you should take to have a healthy pregnancy in the future. °· Keep all follow-up visits as told by your health care provider. This is important. °Where to find more information °· The American Congress of Obstetricians [employer] Gynecologists: www.acog.org °· U.[place]. Department of Health and Human Services Office of Women’s Health: www.womenshealth.gov °Contact a health care provider if: °· You have a fever or chills. °· You have a foul smelling vaginal discharge. °· You have more bleeding instead of less. °Get help right away if: °· You have severe cramps or pain in your back or abdomen. °· You pass blood clots or tissue from your vagina that is walnut-sized or larger. °· You soak more than 1 regular sanitary pad in an hour. °· You become light-headed or weak. °· You pass out. °· You have feelings of sadness that take over your thoughts, or you have thoughts of hurting yourself. °Summary °· Most miscarriages happen in the first 3 months of pregnancy. Sometimes miscarriage happens before a woman even knows that she is pregnant. °· Follow your health care provider's instruction for home care. Keep all follow-up appointments. °· To help you and your partner with the process of grieving, talk with your health care provider or seek counseling. °This information is not intended to replace advice given to you by your health care provider. Make sure you discuss any questions you have with your health care provider. °Document Released: 11/23/2000 Document Revised: 09/21/2018 Document Reviewed: 07/05/2016 °Elsevier Patient Education © 2020 [employer] Inc[employer] ° °

## 2018-12-22 NOTE — MAU Provider Note (Signed)
Chief Complaint: Vaginal Bleeding and Dizziness   First Provider Initiated Contact with Patient 12/22/18 0942     SUBJECTIVE HPI: Connie Lopez is a 38 y.o. G4P1011 at 6447w3d who presents to [facility] reporting vaginal bleeding. Sees Dr. Billy Coastaavon for ob care. Was in office on Wednesday & had an ultrasound that showed a 7+ wk live IUP. On Thursday she had an episode of heavy bleeding & in the office an ultrasound confirmed a complete miscarriage. Since then bleeding has increased. States she has been passing golf ball sized clots since this morning. After episode of bleeding this morning she had a 15 minute episode of dizziness. Since then is no longer dizzy but just "doesn't feel right". Denies abdominal pain or fever.    Past Medical History:  Diagnosis Date  . Migraines    OB History  Gravida Para Term Preterm AB Living  4 1 1   1 1   SAB TAB Ectopic Multiple Live Births  1     0 1    # Outcome Date GA Lbr Len/2nd Weight Sex Delivery Anes PTL Lv  4 Current           3 Term 07/06/17 5344w0d 01:32 / 00:31 3209 g M Vag-Vacuum Local  LIV     Birth Comments: wnl  2 SAB 2018          1 Gravida            Past Surgical History:  Procedure Laterality Date  . LASIK    . Lower GI    . TONSILLECTOMY    . TONSILLECTOMY AND ADENOIDECTOMY    . UPPER GI ENDOSCOPY    . WISDOM TOOTH EXTRACTION     Social History   Socioeconomic History  . Marital status: Married    Spouse name: Not on file  . Number of children: Not on file  . Years of education: Not on file  . Highest education level: Not on file  Occupational History  . Not on file  Social Needs  . Financial resource strain: Not on file  . Food insecurity    Worry: Not on file    Inability: Not on file  . Transportation needs    Medical: Not on file    Non-medical: Not on file  Tobacco Use  . Smoking status: Never Smoker  . Smokeless tobacco: Never Used  Substance and Sexual Activity  . Alcohol use: No    Comment: Social  prior to preg  . Drug use: No  . Sexual activity: Yes  Lifestyle  . Physical activity    Days per week: Not on file    Minutes per session: Not on file  . Stress: Not on file  Relationships  . Social Musicianconnections    Talks on phone: Not on file    Gets together: Not on file    Attends religious service: Not on file    Active member of club or organization: Not on file    Attends meetings of clubs or organizations: Not on file    Relationship status: Not on file  . Intimate partner violence    Fear of current or ex partner: Not on file    Emotionally abused: Not on file    Physically abused: Not on file    Forced sexual activity: Not on file  Other Topics Concern  . Not on file  Social History Narrative  . Not on file   Family History  Problem Relation Age of  Onset  . Colon cancer Maternal Grandmother   . Hypertension Maternal Grandmother   . Hypertension Maternal Grandfather   . Lung cancer Paternal Grandfather    No current facility-administered medications on file prior to encounter.    Current Outpatient Medications on File Prior to Encounter  Medication Sig Dispense Refill  . Acetaminophen (TYLENOL PO) Take 500 mg by mouth every 6 (six) hours as needed (pain).     . coconut oil OIL Apply 1 application topically as needed.  0  . Docusate Sodium (COLACE PO) Take 1 tablet by mouth as needed (mild constipation).     Marland Kitchen. ibuprofen (ADVIL,MOTRIN) 600 MG tablet Take 1 tablet (600 mg total) by mouth every 6 (six) hours. 30 tablet 0  . magnesium oxide (MAG-OX) 400 (241.3 Mg) MG tablet Take 1 tablet (400 mg total) by mouth daily.    . Prenatal Multivit-Min-Fe-FA (PRENATAL VITAMINS PO) Take 1 tablet by mouth daily.     . valACYclovir (VALTREX) 1000 MG tablet Take 1 tablet by mouth daily as needed (cold sore).      Allergies  Allergen Reactions  . Penicillins Rash    Childhood reaction Has patient had a PCN reaction causing immediate rash, facial/tongue/throat swelling, SOB or  lightheadedness with hypotension: Yes Has patient had a PCN reaction causing severe rash involving mucus membranes or skin necrosis: No Has patient had a PCN reaction that required hospitalization: No Has patient had a PCN reaction occurring within the last 10 years: No If all of the above answers are "NO", then may proceed with Cephalosporin use.     I have reviewed patient's Past Medical Hx, Surgical Hx, Family Hx, Social Hx, medications and allergies.   Review of Systems  Constitutional: Negative.   Gastrointestinal: Negative.   Genitourinary: Positive for vaginal bleeding.  Neurological: Positive for dizziness. Negative for syncope.    OBJECTIVE Patient Vitals for the past 24 hrs:  BP Temp Temp src Pulse Resp SpO2  12/22/18 1156 104/65 - - 72 16 -  12/22/18 0918 130/67 97.8 F (36.6 C) Oral 87 18 100 %   Orthostatic VS for the past 24 hrs:  BP- Lying Pulse- Lying BP- Sitting Pulse- Sitting BP- Standing at 0 minutes Pulse- Standing at 0 minutes  12/22/18 0959 118/57 74 115/71 80 115/77 102     Constitutional: Well-developed, well-nourished female in no acute distress.  Cardiovascular: normal rate & rhythm, no murmur Respiratory: normal rate and effort. Lung sounds clear throughout GI: Abd soft, non-tender, Pos BS x 4. No guarding or rebound tenderness MS: Extremities nontender, no edema, normal ROM Neurologic: Alert and oriented x 4.  GU:     SPECULUM EXAM: NEFG, small amount of dark red blood cleared out with 1 fox swab, slow ooze of dark red blood from os.   BIMANUAL: No CMT. cervix 1 cm; no uterine tenderness    LAB RESULTS Results for orders placed or performed during the hospital encounter of 12/22/18 (from the past 24 hour(s))  CBC     Status: None   Collection Time: 12/22/18  9:55 AM  Result Value Ref Range   WBC 5.1 4.0 - 10.5 K/uL   RBC 4.64 3.87 - 5.11 MIL/uL   Hemoglobin 14.6 12.0 - 15.0 g/dL   HCT 40.942.9 81.136.0 - 91.446.0 %   MCV 92.5 80.0 - 100.0 fL   MCH 31.5  26.0 - 34.0 pg   MCHC 34.0 30.0 - 36.0 g/dL   RDW 78.212.7 95.611.5 - 21.315.5 %  Platelets 205 150 - 400 K/uL   nRBC 0.0 0.0 - 0.2 %  hCG, quantitative, pregnancy     Status: Abnormal   Collection Time: 12/22/18  9:55 AM  Result Value Ref Range   hCG, Beta Chain, Quant, S 2,261 (H) <5 mIU/mL    IMAGING [place] Ob Less Than 14 Weeks With Ob Transvaginal  Result Date: 12/22/2018 CLINICAL DATA:  Patient sustained a miscarriage 2 days ago. Vaginal bleeding today. Current quantitative beta HCG level is 2,261. EXAM: OBSTETRIC <14 WK US AND TRANSVAGINAL OB US TECHNIQUE: Both transabdominal and transvaginal ultrasound examinations were performed for complete evaluation of the gestation as well as the maternal uterus, adnexal regions, and pelvic cul-de-sac. Transvaginal technique was performed to assess early pregnancy. COMPARISON:  None. FINDINGS: Intrauterine gestational sac: None Yolk sac:  Visualized. Embryo:  Visualized. Cardiac Activity: Not applicable Maternal uterus/adnexae: Small mural fibroid measuring 1 cm in size. This lies along the anterior upper uterine segment. No other uterine masses. Prominent periuterine veins. Endometrium measures 6 mm in thickness. No endometrial mass or fluid. Normal ovaries and adnexa.  Trace pelvic free fluid. IMPRESSION: 1. No evidence of retained products of conception. 2. Small anterior upper uterine segment mural fibroid. 3. Normal thickness endometrium.  No endometrial fluid. 4. Normal ovaries and adnexa. Electronically Signed   By: Amie Portlandavid  Ormond M.D.   On: 12/22/2018 11:31    MAU COURSE Orders Placed This Encounter  Procedures  . US OB LESS THAN 14 WEEKS WITH OB TRANSVAGINAL  . CBC  . hCG, quantitative, pregnancy  . Orthostatic vital signs  . Discharge patient   Meds ordered this encounter  Medications  . misoprostol (CYTOTEC) 200 MCG tablet    Sig: Take 4 tablets (800 mcg total) by mouth once for 1 dose.    Dispense:  4 tablet    Refill:  0    Order Specific  Question:   Supervising Provider    Answer:   [place] BingPICKENS, CHARLIE [[id number]]    MDM CBC - hemoglobin stable at 14.6. Pt not orthostatic.  Small amount of blood seen on exam  RH positive  Ultrasound shows no signs of retained POCs  ASSESSMENT 1. Miscarriage   2. Vaginal bleeding in pregnancy, first trimester     PLAN Discharge home in stable condition. Bleeding precautions  Follow-up Information    Olivia Mackieaavon, Richard, MD Follow up.   Specialty: Obstetrics and Gynecology Contact information: [address] [place] [id number] [phone]          Allergies as of 12/22/2018      Reactions   Penicillins Rash   Childhood reaction Has patient had a PCN reaction causing immediate rash, facial/tongue/throat swelling, SOB or lightheadedness with hypotension: Yes Has patient had a PCN reaction causing severe rash involving mucus membranes or skin necrosis: No Has patient had a PCN reaction that required hospitalization: No Has patient had a PCN reaction occurring within the last 10 years: No If all of the above answers are "NO", then may proceed with Cephalosporin use.      Medication List    STOP taking these medications   benzocaine-Menthol 20-0.5 % Aero Commonly known as: DERMOPLAST   hydrocortisone 2.5 % rectal cream Commonly known as: Proctosol HC   witch hazel-glycerin pad Commonly known as: TUCKS     TAKE these medications   coconut oil Oil Apply 1 application topically as needed.   COLACE PO Take 1 tablet by mouth as needed (mild constipation).   ibuprofen 600 MG  tablet Commonly known as: ADVIL Take 1 tablet (600 mg total) by mouth every 6 (six) hours.   magnesium oxide 400 (241.3 Mg) MG tablet Commonly known as: MAG-OX Take 1 tablet (400 mg total) by mouth daily.   misoprostol 200 MCG tablet Commonly known as: CYTOTEC Take 4 tablets (800 mcg total) by mouth once for 1 dose.   PRENATAL VITAMINS PO Take 1 tablet by mouth daily.   TYLENOL  PO Take 500 mg by mouth every 6 (six) hours as needed (pain).   valACYclovir 1000 MG tablet Commonly known as: VALTREX Take 1 tablet by mouth daily as needed (cold sore).        Jorje Guild, NP 12/22/2018  12:16 PM

## 2018-12-22 NOTE — MAU Note (Signed)
Pt reports she had a miscarriage confirmed in the office on Thursday.  Pt states she was told the bleeding should begin to "taper off" within 24 hrs. Pt reports heavy bleeding since waking up this morning and passing several golf ball size clots. Pt reports feeling weak after the heavy bleeding started

## 2019-01-02 DIAGNOSIS — O039 Complete or unspecified spontaneous abortion without complication: Secondary | ICD-10-CM | POA: Diagnosis not present

## 2019-01-22 DIAGNOSIS — Q999 Chromosomal abnormality, unspecified: Secondary | ICD-10-CM | POA: Diagnosis not present

## 2019-01-22 DIAGNOSIS — O039 Complete or unspecified spontaneous abortion without complication: Secondary | ICD-10-CM | POA: Diagnosis not present

## 2019-01-28 DIAGNOSIS — N96 Recurrent pregnancy loss: Secondary | ICD-10-CM | POA: Diagnosis not present

## 2019-02-11 DIAGNOSIS — O09291 Supervision of pregnancy with other poor reproductive or obstetric history, first trimester: Secondary | ICD-10-CM | POA: Diagnosis not present

## 2019-02-11 DIAGNOSIS — Z3200 Encounter for pregnancy test, result unknown: Secondary | ICD-10-CM | POA: Diagnosis not present

## 2019-02-11 DIAGNOSIS — Z3A01 Less than 8 weeks gestation of pregnancy: Secondary | ICD-10-CM | POA: Diagnosis not present

## 2019-02-11 DIAGNOSIS — Z3689 Encounter for other specified antenatal screening: Secondary | ICD-10-CM | POA: Diagnosis not present

## 2019-02-13 DIAGNOSIS — O09299 Supervision of pregnancy with other poor reproductive or obstetric history, unspecified trimester: Secondary | ICD-10-CM | POA: Diagnosis not present

## 2019-02-13 DIAGNOSIS — Z3A01 Less than 8 weeks gestation of pregnancy: Secondary | ICD-10-CM | POA: Diagnosis not present

## 2019-02-21 DIAGNOSIS — Z3A01 Less than 8 weeks gestation of pregnancy: Secondary | ICD-10-CM | POA: Diagnosis not present

## 2019-02-21 DIAGNOSIS — O09291 Supervision of pregnancy with other poor reproductive or obstetric history, first trimester: Secondary | ICD-10-CM | POA: Diagnosis not present

## 2019-03-07 DIAGNOSIS — O021 Missed abortion: Secondary | ICD-10-CM | POA: Diagnosis not present

## 2019-03-11 DIAGNOSIS — O021 Missed abortion: Secondary | ICD-10-CM | POA: Diagnosis not present

## 2019-03-11 DIAGNOSIS — Z3A01 Less than 8 weeks gestation of pregnancy: Secondary | ICD-10-CM | POA: Diagnosis not present

## 2019-03-11 DIAGNOSIS — O09291 Supervision of pregnancy with other poor reproductive or obstetric history, first trimester: Secondary | ICD-10-CM | POA: Diagnosis not present

## 2019-03-12 DIAGNOSIS — O021 Missed abortion: Secondary | ICD-10-CM | POA: Diagnosis not present

## 2019-04-09 DIAGNOSIS — N96 Recurrent pregnancy loss: Secondary | ICD-10-CM | POA: Diagnosis not present

## 2019-04-11 DIAGNOSIS — Z319 Encounter for procreative management, unspecified: Secondary | ICD-10-CM | POA: Diagnosis not present

## 2019-04-11 DIAGNOSIS — N96 Recurrent pregnancy loss: Secondary | ICD-10-CM | POA: Diagnosis not present

## 2019-04-11 DIAGNOSIS — Z3141 Encounter for fertility testing: Secondary | ICD-10-CM | POA: Diagnosis not present

## 2019-04-11 DIAGNOSIS — Z31430 Encounter of female for testing for genetic disease carrier status for procreative management: Secondary | ICD-10-CM | POA: Diagnosis not present

## 2019-04-18 DIAGNOSIS — N840 Polyp of corpus uteri: Secondary | ICD-10-CM | POA: Diagnosis not present

## 2019-04-18 DIAGNOSIS — N96 Recurrent pregnancy loss: Secondary | ICD-10-CM | POA: Diagnosis not present

## 2019-04-19 DIAGNOSIS — Z113 Encounter for screening for infections with a predominantly sexual mode of transmission: Secondary | ICD-10-CM | POA: Diagnosis not present

## 2019-04-25 DIAGNOSIS — O034 Incomplete spontaneous abortion without complication: Secondary | ICD-10-CM | POA: Diagnosis not present

## 2019-04-25 DIAGNOSIS — O021 Missed abortion: Secondary | ICD-10-CM | POA: Diagnosis not present

## 2019-04-25 DIAGNOSIS — N840 Polyp of corpus uteri: Secondary | ICD-10-CM | POA: Diagnosis not present

## 2019-04-30 DIAGNOSIS — N644 Mastodynia: Secondary | ICD-10-CM | POA: Diagnosis not present

## 2019-05-08 DIAGNOSIS — N644 Mastodynia: Secondary | ICD-10-CM | POA: Diagnosis not present

## 2019-05-13 DIAGNOSIS — Z20828 Contact with and (suspected) exposure to other viral communicable diseases: Secondary | ICD-10-CM | POA: Diagnosis not present

## 2019-06-11 DIAGNOSIS — Z20828 Contact with and (suspected) exposure to other viral communicable diseases: Secondary | ICD-10-CM | POA: Diagnosis not present

## 2019-06-14 NOTE — L&D Delivery Note (Signed)
Delivery Note At 11:44 AM a viable and healthy female was delivered via  (Presentation: Left Occiput Anterior).  APGAR: 9, 9; weight  .   Placenta status: Spontaneous, Intact.  Cord: 3 vessels with the following complications: None.  Cord pH: na  Anesthesia: Epidural Episiotomy: None Lacerations: 2nd degree Suture Repair: 2.0 vicryl rapide Est. Blood Loss (mL): 127  Mom to postpartum.  Baby to Couplet care / Skin to Skin.  Tahtiana Rozier J 04/01/2020, 12:46 PM

## 2019-07-10 DIAGNOSIS — M25512 Pain in left shoulder: Secondary | ICD-10-CM | POA: Diagnosis not present

## 2019-07-10 DIAGNOSIS — M5030 Other cervical disc degeneration, unspecified cervical region: Secondary | ICD-10-CM | POA: Diagnosis not present

## 2019-07-16 DIAGNOSIS — D485 Neoplasm of uncertain behavior of skin: Secondary | ICD-10-CM | POA: Diagnosis not present

## 2019-07-16 DIAGNOSIS — D2272 Melanocytic nevi of left lower limb, including hip: Secondary | ICD-10-CM | POA: Diagnosis not present

## 2019-07-16 DIAGNOSIS — R21 Rash and other nonspecific skin eruption: Secondary | ICD-10-CM | POA: Diagnosis not present

## 2019-07-16 DIAGNOSIS — L718 Other rosacea: Secondary | ICD-10-CM | POA: Diagnosis not present

## 2019-07-16 DIAGNOSIS — D225 Melanocytic nevi of trunk: Secondary | ICD-10-CM | POA: Diagnosis not present

## 2019-07-16 DIAGNOSIS — B351 Tinea unguium: Secondary | ICD-10-CM | POA: Diagnosis not present

## 2019-07-25 DIAGNOSIS — Z3201 Encounter for pregnancy test, result positive: Secondary | ICD-10-CM | POA: Diagnosis not present

## 2019-07-25 DIAGNOSIS — Z3200 Encounter for pregnancy test, result unknown: Secondary | ICD-10-CM | POA: Diagnosis not present

## 2019-07-29 DIAGNOSIS — Z3201 Encounter for pregnancy test, result positive: Secondary | ICD-10-CM | POA: Diagnosis not present

## 2019-07-29 DIAGNOSIS — Z3200 Encounter for pregnancy test, result unknown: Secondary | ICD-10-CM | POA: Diagnosis not present

## 2019-08-14 DIAGNOSIS — Z3200 Encounter for pregnancy test, result unknown: Secondary | ICD-10-CM | POA: Diagnosis not present

## 2019-08-26 DIAGNOSIS — O0900 Supervision of pregnancy with history of infertility, unspecified trimester: Secondary | ICD-10-CM | POA: Diagnosis not present

## 2019-09-10 DIAGNOSIS — Z3A10 10 weeks gestation of pregnancy: Secondary | ICD-10-CM | POA: Diagnosis not present

## 2019-09-10 DIAGNOSIS — O09521 Supervision of elderly multigravida, first trimester: Secondary | ICD-10-CM | POA: Diagnosis not present

## 2019-09-10 DIAGNOSIS — Z3689 Encounter for other specified antenatal screening: Secondary | ICD-10-CM | POA: Diagnosis not present

## 2019-09-10 LAB — OB RESULTS CONSOLE HEPATITIS B SURFACE ANTIGEN: Hepatitis B Surface Ag: NEGATIVE

## 2019-09-10 LAB — OB RESULTS CONSOLE ABO/RH: RH Type: POSITIVE

## 2019-09-10 LAB — OB RESULTS CONSOLE RUBELLA ANTIBODY, IGM: Rubella: IMMUNE

## 2019-09-10 LAB — OB RESULTS CONSOLE ANTIBODY SCREEN: Antibody Screen: NEGATIVE

## 2019-09-10 LAB — OB RESULTS CONSOLE HIV ANTIBODY (ROUTINE TESTING): HIV: NONREACTIVE

## 2019-09-10 LAB — OB RESULTS CONSOLE RPR: RPR: NONREACTIVE

## 2019-10-01 DIAGNOSIS — Z3682 Encounter for antenatal screening for nuchal translucency: Secondary | ICD-10-CM | POA: Diagnosis not present

## 2019-10-01 DIAGNOSIS — Z3A13 13 weeks gestation of pregnancy: Secondary | ICD-10-CM | POA: Diagnosis not present

## 2019-10-01 DIAGNOSIS — O09521 Supervision of elderly multigravida, first trimester: Secondary | ICD-10-CM | POA: Diagnosis not present

## 2019-10-22 DIAGNOSIS — O09522 Supervision of elderly multigravida, second trimester: Secondary | ICD-10-CM | POA: Diagnosis not present

## 2019-10-22 DIAGNOSIS — Z3A16 16 weeks gestation of pregnancy: Secondary | ICD-10-CM | POA: Diagnosis not present

## 2019-10-22 DIAGNOSIS — Z361 Encounter for antenatal screening for raised alphafetoprotein level: Secondary | ICD-10-CM | POA: Diagnosis not present

## 2019-10-25 DIAGNOSIS — O26892 Other specified pregnancy related conditions, second trimester: Secondary | ICD-10-CM | POA: Diagnosis not present

## 2019-10-25 DIAGNOSIS — L249 Irritant contact dermatitis, unspecified cause: Secondary | ICD-10-CM | POA: Diagnosis not present

## 2019-10-25 DIAGNOSIS — Z3A16 16 weeks gestation of pregnancy: Secondary | ICD-10-CM | POA: Diagnosis not present

## 2019-10-25 DIAGNOSIS — L718 Other rosacea: Secondary | ICD-10-CM | POA: Diagnosis not present

## 2019-10-25 DIAGNOSIS — N898 Other specified noninflammatory disorders of vagina: Secondary | ICD-10-CM | POA: Diagnosis not present

## 2019-11-18 DIAGNOSIS — O09522 Supervision of elderly multigravida, second trimester: Secondary | ICD-10-CM | POA: Diagnosis not present

## 2019-11-18 DIAGNOSIS — Z3A20 20 weeks gestation of pregnancy: Secondary | ICD-10-CM | POA: Diagnosis not present

## 2019-12-17 DIAGNOSIS — O09522 Supervision of elderly multigravida, second trimester: Secondary | ICD-10-CM | POA: Diagnosis not present

## 2019-12-17 DIAGNOSIS — Z3A24 24 weeks gestation of pregnancy: Secondary | ICD-10-CM | POA: Diagnosis not present

## 2020-01-03 DIAGNOSIS — Z3689 Encounter for other specified antenatal screening: Secondary | ICD-10-CM | POA: Diagnosis not present

## 2020-01-03 DIAGNOSIS — O09522 Supervision of elderly multigravida, second trimester: Secondary | ICD-10-CM | POA: Diagnosis not present

## 2020-01-03 DIAGNOSIS — Z3A26 26 weeks gestation of pregnancy: Secondary | ICD-10-CM | POA: Diagnosis not present

## 2020-01-14 DIAGNOSIS — S91119A Laceration without foreign body of unspecified toe without damage to nail, initial encounter: Secondary | ICD-10-CM | POA: Diagnosis not present

## 2020-01-17 IMAGING — US OBSTETRIC <14 WK US AND TRANSVAGINAL OB US
1 series · 15 of 28 positions shown · non-contrast
Comparison: None.

CLINICAL DATA: Patient sustained a miscarriage 2 days ago. Vaginal
bleeding today. Current quantitative beta HCG level is [DATE].

EXAM:
OBSTETRIC <14 WK US AND TRANSVAGINAL OB US
TECHNIQUE: Both transabdominal and transvaginal ultrasound examinations were
performed for complete evaluation of the gestation as well as the
maternal uterus, adnexal regions, and pelvic cul-de-sac.
Transvaginal technique was performed to assess early pregnancy.

[Series 1: obstetric <14 wk us and transvaginal ob us · 15 of 68 slices shown]
[im 1/68]
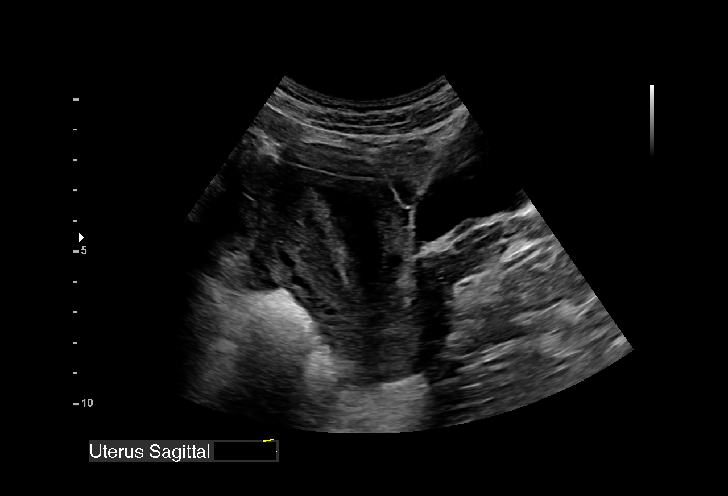
[im 5/68]
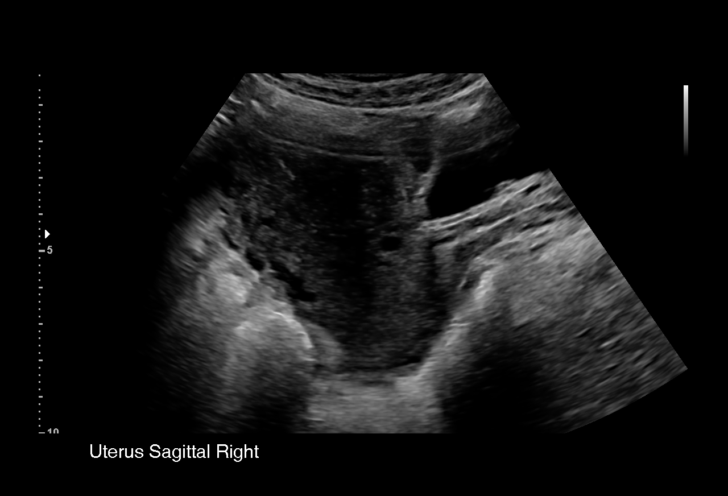
[im 10/68]
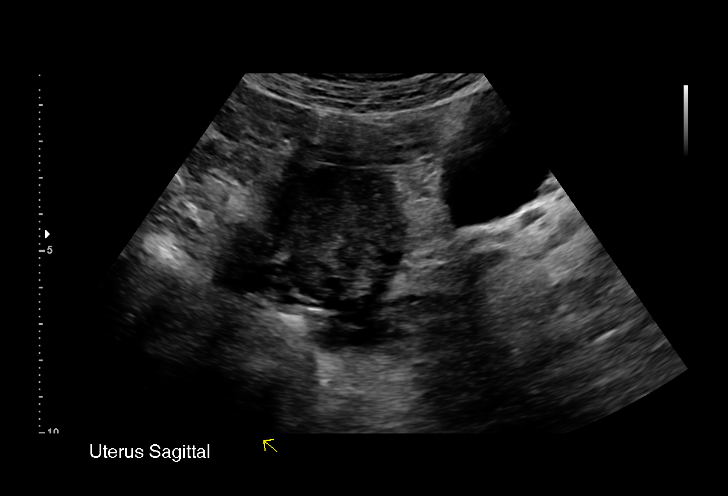
[im 15/68]
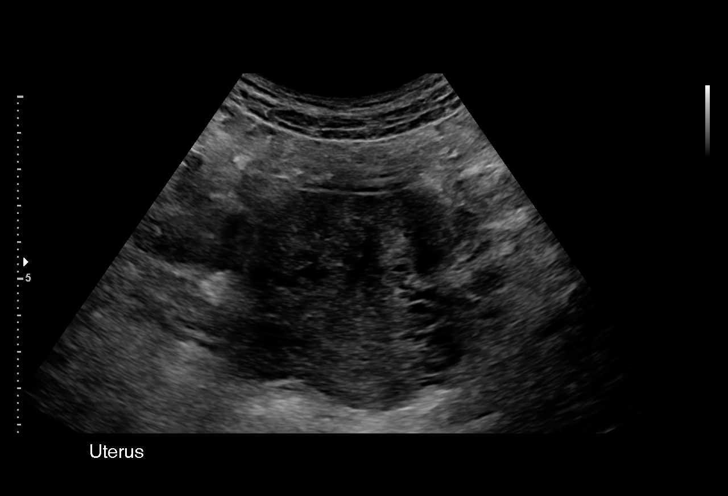
[im 20/68]
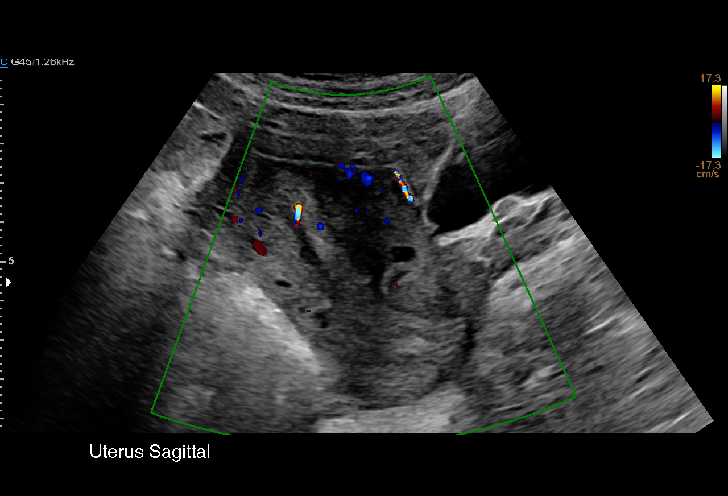
[im 25/68]
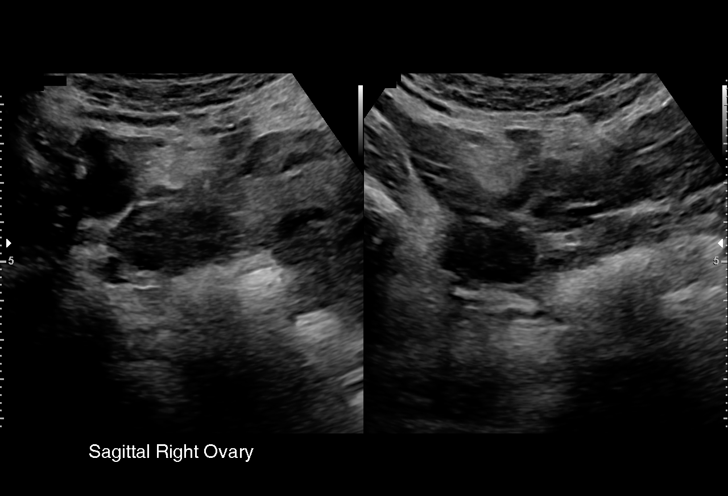
[im 30/68]
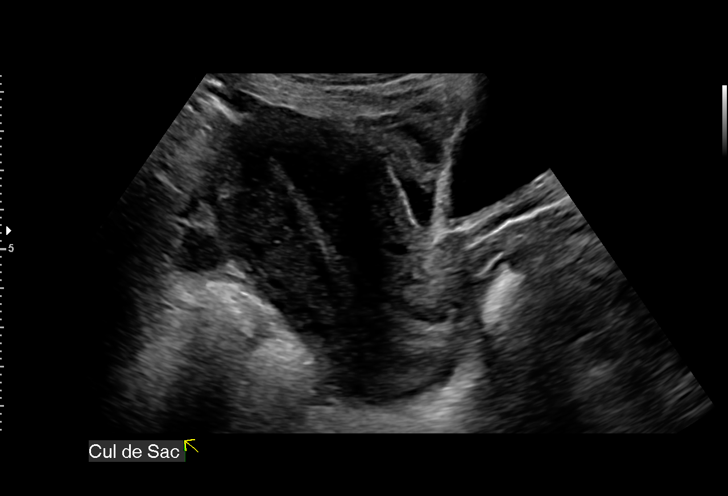
[im 35/68]
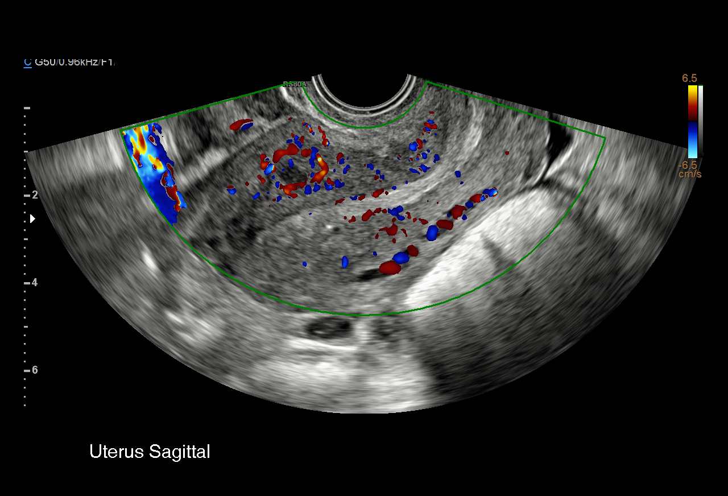
[im 38/68]
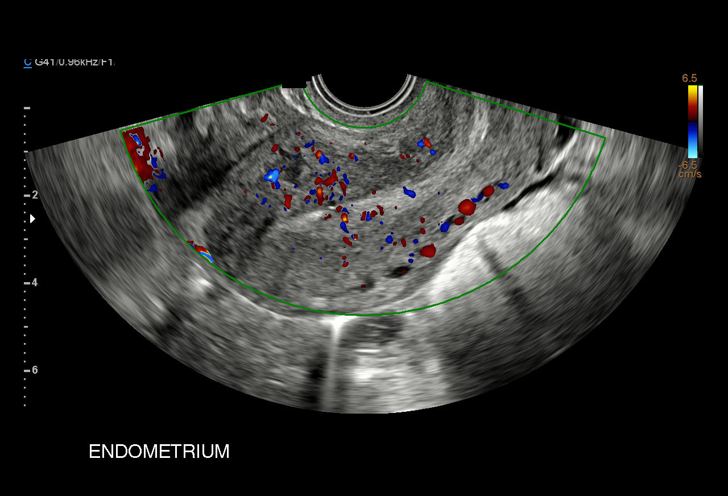
[im 43/68]
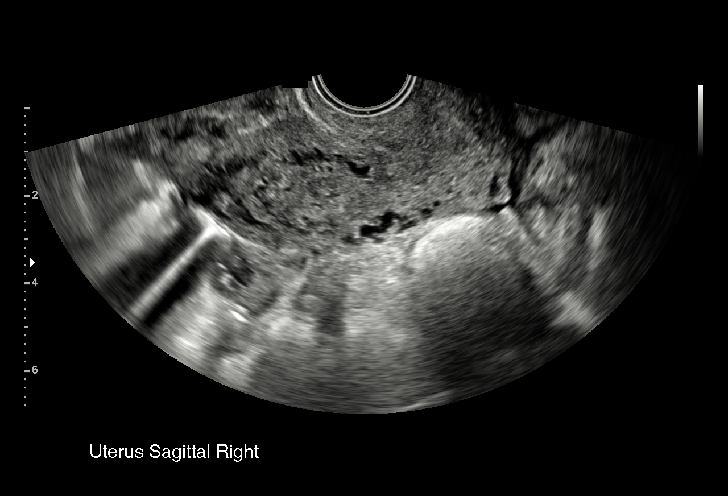
[im 48/68]
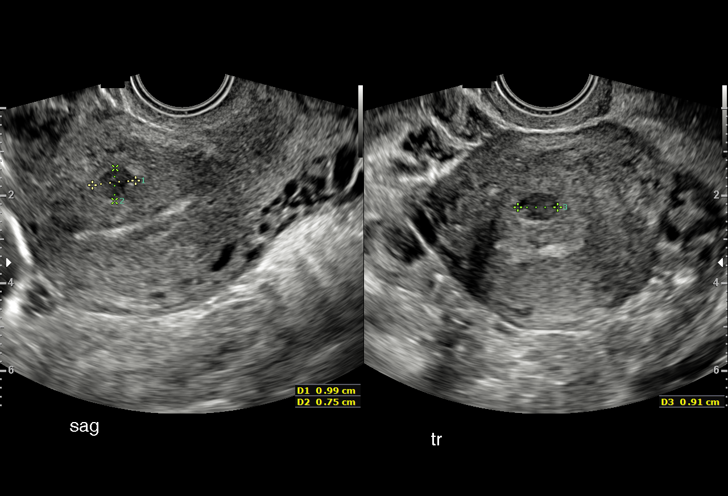
[im 53/68]
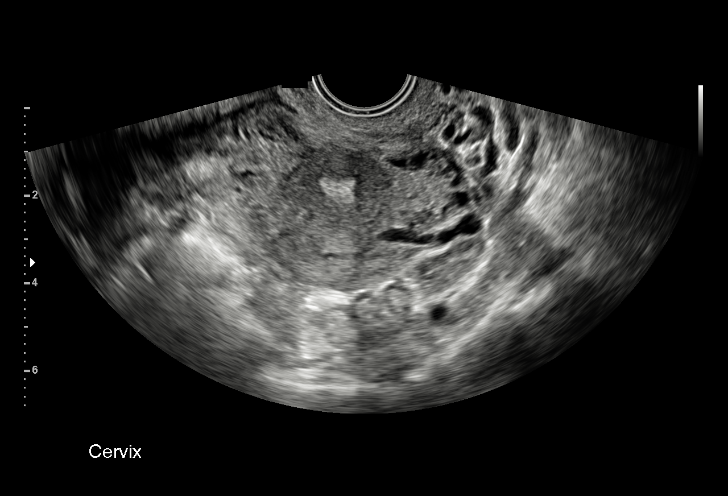
[im 58/68]
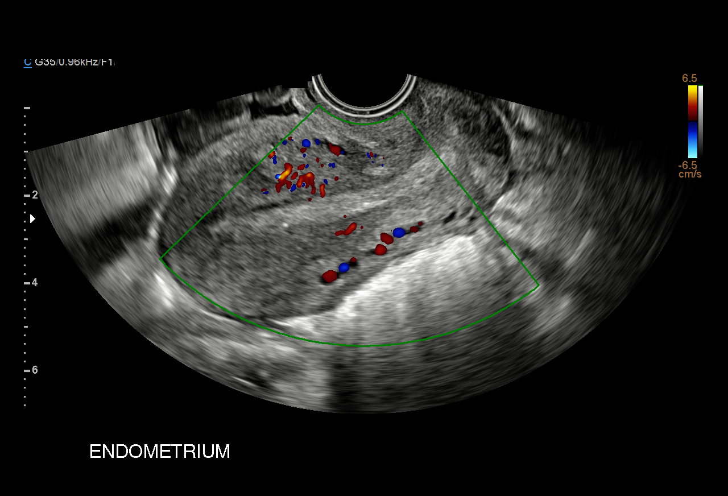
[im 63/68]
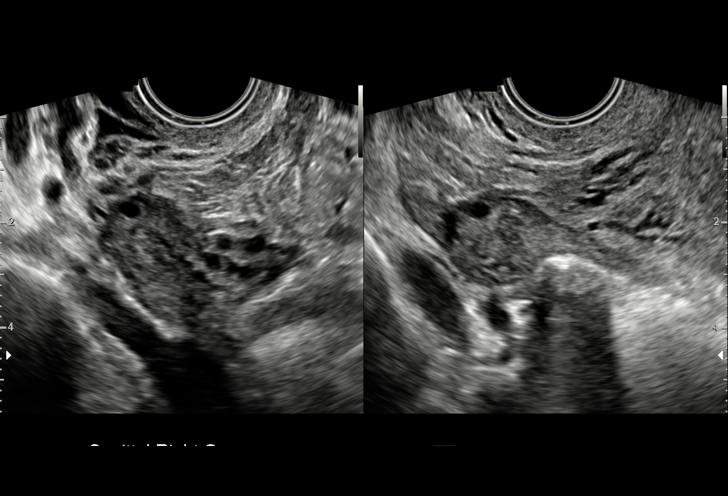
[im 68/68]
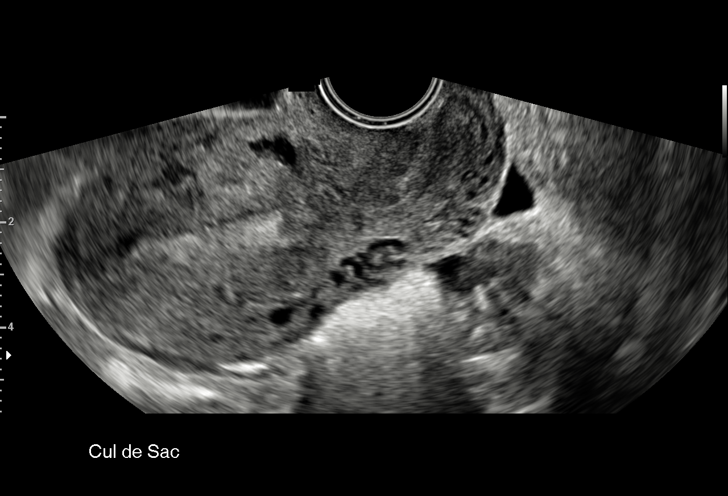

[15 of 28 positions shown; findings below may reference images not displayed]

FINDINGS: Intrauterine gestational sac: None

Yolk sac:  Visualized.

Embryo:  Visualized.

Cardiac Activity: Not applicable

Maternal uterus/adnexae: Small mural fibroid measuring 1 cm in size.
This lies along the anterior upper uterine segment. No other uterine
masses. Prominent periuterine veins. Endometrium measures 6 mm in
thickness. No endometrial mass or fluid.

Normal ovaries and adnexa.  Trace pelvic free fluid.
IMPRESSION: 1. No evidence of retained products of conception.
2. Small anterior upper uterine segment mural fibroid.
3. Normal thickness endometrium.  No endometrial fluid.
4. Normal ovaries and adnexa.

## 2020-01-22 DIAGNOSIS — O09523 Supervision of elderly multigravida, third trimester: Secondary | ICD-10-CM | POA: Diagnosis not present

## 2020-01-22 DIAGNOSIS — Z3A29 29 weeks gestation of pregnancy: Secondary | ICD-10-CM | POA: Diagnosis not present

## 2020-01-22 DIAGNOSIS — Z3689 Encounter for other specified antenatal screening: Secondary | ICD-10-CM | POA: Diagnosis not present

## 2020-02-07 DIAGNOSIS — Z3A31 31 weeks gestation of pregnancy: Secondary | ICD-10-CM | POA: Diagnosis not present

## 2020-02-07 DIAGNOSIS — O09523 Supervision of elderly multigravida, third trimester: Secondary | ICD-10-CM | POA: Diagnosis not present

## 2020-02-18 DIAGNOSIS — Z3A33 33 weeks gestation of pregnancy: Secondary | ICD-10-CM | POA: Diagnosis not present

## 2020-02-18 DIAGNOSIS — O09523 Supervision of elderly multigravida, third trimester: Secondary | ICD-10-CM | POA: Diagnosis not present

## 2020-02-22 ENCOUNTER — Ambulatory Visit: Payer: BC Managed Care – PPO | Attending: Internal Medicine

## 2020-02-22 ENCOUNTER — Other Ambulatory Visit: Payer: Self-pay

## 2020-02-22 DIAGNOSIS — Z23 Encounter for immunization: Secondary | ICD-10-CM

## 2020-02-22 NOTE — Progress Notes (Signed)
   Covid-19 Vaccination Clinic  Name:  Jordynne Mccown    MRN: [id number] DOB: 10-31-1980  02/22/2020  Ms. Hofacker was observed post Covid-19 immunization for 15 minutes without incident. She was provided with Vaccine Information Sheet and instruction to access the V-Safe system.   Ms. Befort was instructed to call 911 with any severe reactions post vaccine: Marland Kitchen Difficulty breathing  . Swelling of face and throat  . A fast heartbeat  . A bad rash all over body  . Dizziness and weakness

## 2020-03-06 DIAGNOSIS — Z3A35 35 weeks gestation of pregnancy: Secondary | ICD-10-CM | POA: Diagnosis not present

## 2020-03-06 DIAGNOSIS — O09523 Supervision of elderly multigravida, third trimester: Secondary | ICD-10-CM | POA: Diagnosis not present

## 2020-03-06 DIAGNOSIS — Z3685 Encounter for antenatal screening for Streptococcus B: Secondary | ICD-10-CM | POA: Diagnosis not present

## 2020-03-06 DIAGNOSIS — Z23 Encounter for immunization: Secondary | ICD-10-CM | POA: Diagnosis not present

## 2020-03-11 DIAGNOSIS — O09523 Supervision of elderly multigravida, third trimester: Secondary | ICD-10-CM | POA: Diagnosis not present

## 2020-03-11 DIAGNOSIS — Z3A36 36 weeks gestation of pregnancy: Secondary | ICD-10-CM | POA: Diagnosis not present

## 2020-03-17 DIAGNOSIS — Z3A37 37 weeks gestation of pregnancy: Secondary | ICD-10-CM | POA: Diagnosis not present

## 2020-03-17 DIAGNOSIS — O09523 Supervision of elderly multigravida, third trimester: Secondary | ICD-10-CM | POA: Diagnosis not present

## 2020-03-23 ENCOUNTER — Encounter (HOSPITAL_COMMUNITY): Payer: Self-pay | Admitting: Obstetrics and Gynecology

## 2020-03-23 ENCOUNTER — Other Ambulatory Visit: Payer: Self-pay

## 2020-03-23 ENCOUNTER — Inpatient Hospital Stay (HOSPITAL_COMMUNITY)
Admission: AD | Admit: 2020-03-23 | Discharge: 2020-03-23 | Disposition: A | Discharge status: Home / Self Care | Payer: BC Managed Care – PPO | Attending: Obstetrics and Gynecology | Admitting: Obstetrics and Gynecology

## 2020-03-23 DIAGNOSIS — O479 False labor, unspecified: Secondary | ICD-10-CM

## 2020-03-23 DIAGNOSIS — Z3A38 38 weeks gestation of pregnancy: Secondary | ICD-10-CM | POA: Diagnosis not present

## 2020-03-23 DIAGNOSIS — Z3689 Encounter for other specified antenatal screening: Secondary | ICD-10-CM

## 2020-03-23 DIAGNOSIS — O471 False labor at or after 37 completed weeks of gestation: Secondary | ICD-10-CM

## 2020-03-23 NOTE — MAU Provider Note (Signed)
S: Ms. Addilynn Mowrer is a 39 y.o. [phone] at [redacted]w[redacted]d  who presents to MAU today for labor evaluation.     Cervical exam by RN: no cervical change over 1 hour  Dilation: 3 Effacement (%): 80 Cervical Position: Posterior Station: -2 Presentation: Vertex Exam by:: Holly Flippin RN  Fetal Monitoring: Baseline: 140 Variability: moderate Accelerations: present  Decelerations: none  Contractions: occasional UC with UI   MDM Discussed patient with RN. NST reviewed.   A: SIUP at [redacted]w[redacted]d  False labor  P: Discharge home Labor precautions and kick counts included in AVS Patient to follow-up with [facility] as scheduled  Patient may return to MAU as needed or when in labor   Sharyon Cable, CNM 03/23/2020 3:20 AM

## 2020-03-23 NOTE — MAU Note (Signed)
. °  Connie Lopez is a 39 y.o. at [redacted]w[redacted]d here in MAU reporting: lower abdominal pressure, unsure if they are ctx. States that she had the same feeling with her last baby and delivered within 35 minutes of being in the hospital. No VB or LOF. Endorses good fetal movement.   Pain score: 2 Vitals:   03/23/20 0158  BP: 128/85  Pulse: 93  Resp: 15  Temp: 98 F (36.7 C)  SpO2: 100%     FHT:139

## 2020-03-23 NOTE — Discharge Instructions (Signed)

## 2020-03-24 DIAGNOSIS — O09523 Supervision of elderly multigravida, third trimester: Secondary | ICD-10-CM | POA: Diagnosis not present

## 2020-03-24 DIAGNOSIS — Z3A38 38 weeks gestation of pregnancy: Secondary | ICD-10-CM | POA: Diagnosis not present

## 2020-03-26 ENCOUNTER — Telehealth (HOSPITAL_COMMUNITY): Payer: Self-pay | Admitting: *Deleted

## 2020-03-26 ENCOUNTER — Encounter (HOSPITAL_COMMUNITY): Payer: Self-pay | Admitting: *Deleted

## 2020-03-26 NOTE — Telephone Encounter (Signed)
Preadmission screen  

## 2020-03-27 ENCOUNTER — Other Ambulatory Visit: Payer: Self-pay | Admitting: Obstetrics and Gynecology

## 2020-03-30 ENCOUNTER — Other Ambulatory Visit (HOSPITAL_COMMUNITY)
Admission: RE | Admit: 2020-03-30 | Discharge: 2020-03-30 | Disposition: A | Discharge status: Home / Self Care | Payer: BC Managed Care – PPO | Source: Ambulatory Visit | Attending: Obstetrics and Gynecology | Admitting: Obstetrics and Gynecology

## 2020-03-30 DIAGNOSIS — O09523 Supervision of elderly multigravida, third trimester: Secondary | ICD-10-CM | POA: Diagnosis not present

## 2020-03-30 DIAGNOSIS — Z01812 Encounter for preprocedural laboratory examination: Secondary | ICD-10-CM | POA: Insufficient documentation

## 2020-03-30 DIAGNOSIS — Z880 Allergy status to penicillin: Secondary | ICD-10-CM | POA: Diagnosis not present

## 2020-03-30 DIAGNOSIS — Z20822 Contact with and (suspected) exposure to covid-19: Secondary | ICD-10-CM | POA: Diagnosis not present

## 2020-03-30 DIAGNOSIS — Z23 Encounter for immunization: Secondary | ICD-10-CM | POA: Diagnosis not present

## 2020-03-30 DIAGNOSIS — O26893 Other specified pregnancy related conditions, third trimester: Secondary | ICD-10-CM | POA: Diagnosis not present

## 2020-03-30 DIAGNOSIS — Z3A39 39 weeks gestation of pregnancy: Secondary | ICD-10-CM | POA: Diagnosis not present

## 2020-03-30 LAB — SARS CORONAVIRUS 2 (TAT 6-24 HRS): SARS Coronavirus 2: NEGATIVE

## 2020-03-31 DIAGNOSIS — Z3A39 39 weeks gestation of pregnancy: Secondary | ICD-10-CM | POA: Diagnosis not present

## 2020-03-31 DIAGNOSIS — O09523 Supervision of elderly multigravida, third trimester: Secondary | ICD-10-CM | POA: Diagnosis not present

## 2020-04-01 ENCOUNTER — Inpatient Hospital Stay (HOSPITAL_COMMUNITY): Payer: BC Managed Care – PPO

## 2020-04-01 ENCOUNTER — Encounter (HOSPITAL_COMMUNITY): Payer: Self-pay | Admitting: Obstetrics and Gynecology

## 2020-04-01 ENCOUNTER — Other Ambulatory Visit: Payer: Self-pay

## 2020-04-01 ENCOUNTER — Inpatient Hospital Stay (HOSPITAL_COMMUNITY): Payer: BC Managed Care – PPO | Admitting: Anesthesiology

## 2020-04-01 ENCOUNTER — Inpatient Hospital Stay (HOSPITAL_COMMUNITY)
Admission: AD | Admit: 2020-04-01 | Discharge: 2020-04-02 | DRG: 807 | Disposition: A | Discharge status: Home / Self Care | Payer: BC Managed Care – PPO | Attending: Obstetrics and Gynecology | Admitting: Obstetrics and Gynecology

## 2020-04-01 DIAGNOSIS — Z23 Encounter for immunization: Secondary | ICD-10-CM | POA: Diagnosis not present

## 2020-04-01 DIAGNOSIS — Z01812 Encounter for preprocedural laboratory examination: Secondary | ICD-10-CM | POA: Diagnosis not present

## 2020-04-01 DIAGNOSIS — Z3A39 39 weeks gestation of pregnancy: Secondary | ICD-10-CM | POA: Diagnosis not present

## 2020-04-01 DIAGNOSIS — Z20822 Contact with and (suspected) exposure to covid-19: Secondary | ICD-10-CM | POA: Diagnosis present

## 2020-04-01 DIAGNOSIS — Z3490 Encounter for supervision of normal pregnancy, unspecified, unspecified trimester: Secondary | ICD-10-CM | POA: Diagnosis present

## 2020-04-01 DIAGNOSIS — Z880 Allergy status to penicillin: Secondary | ICD-10-CM | POA: Diagnosis not present

## 2020-04-01 DIAGNOSIS — O26893 Other specified pregnancy related conditions, third trimester: Secondary | ICD-10-CM | POA: Diagnosis present

## 2020-04-01 LAB — TYPE AND SCREEN
ABO/RH(D): O POS
Antibody Screen: NEGATIVE

## 2020-04-01 LAB — CBC
HCT: 44 % (ref 36.0–46.0)
Hemoglobin: 14.6 g/dL (ref 12.0–15.0)
MCH: 33 pg (ref 26.0–34.0)
MCHC: 33.2 g/dL (ref 30.0–36.0)
MCV: 99.3 fL (ref 80.0–100.0)
Platelets: 177 10*3/uL (ref 150–400)
RBC: 4.43 MIL/uL (ref 3.87–5.11)
RDW: 14.8 % (ref 11.5–15.5)
WBC: 8.8 10*3/uL (ref 4.0–10.5)
nRBC: 0 % (ref 0.0–0.2)

## 2020-04-01 LAB — RPR: RPR Ser Ql: NONREACTIVE

## 2020-04-01 MED ORDER — EPHEDRINE 5 MG/ML INJ: 10.0000 mg | INTRAVENOUS | Status: DC | PRN

## 2020-04-01 MED ORDER — TETANUS-DIPHTH-ACELL PERTUSSIS 5-2.5-18.5 LF-MCG/0.5 IM SUSP: 0.5000 mL | Freq: Once | INTRAMUSCULAR | Status: DC

## 2020-04-01 MED ORDER — LACTATED RINGERS IV SOLN: 500.0000 mL | INTRAVENOUS | Status: DC | PRN

## 2020-04-01 MED ORDER — LIDOCAINE HCL (PF) 1 % IJ SOLN: 30.0000 mL | INTRAMUSCULAR | Status: DC | PRN

## 2020-04-01 MED ORDER — OXYTOCIN-SODIUM CHLORIDE 30-0.9 UT/500ML-% IV SOLN
1.0000 m[IU]/min | INTRAVENOUS | Status: DC
  Administered 2020-04-01: 2 m[IU]/min via INTRAVENOUS
  Filled 2020-04-01: qty 500

## 2020-04-01 MED ORDER — ACETAMINOPHEN 325 MG PO TABS: 650.0000 mg | ORAL_TABLET | ORAL | Status: DC | PRN

## 2020-04-01 MED ORDER — ONDANSETRON HCL 4 MG PO TABS: 4.0000 mg | ORAL_TABLET | ORAL | Status: DC | PRN

## 2020-04-01 MED ORDER — OXYCODONE-ACETAMINOPHEN 5-325 MG PO TABS: 1.0000 | ORAL_TABLET | ORAL | Status: DC | PRN

## 2020-04-01 MED ORDER — OXYCODONE-ACETAMINOPHEN 5-325 MG PO TABS: 2.0000 | ORAL_TABLET | ORAL | Status: DC | PRN

## 2020-04-01 MED ORDER — WITCH HAZEL-GLYCERIN EX PADS: 1.0000 "application " | MEDICATED_PAD | CUTANEOUS | Status: DC | PRN

## 2020-04-01 MED ORDER — ONDANSETRON HCL 4 MG/2ML IJ SOLN: 4.0000 mg | INTRAMUSCULAR | Status: DC | PRN

## 2020-04-01 MED ORDER — METHYLERGONOVINE MALEATE 0.2 MG PO TABS: 0.2000 mg | ORAL_TABLET | ORAL | Status: DC | PRN

## 2020-04-01 MED ORDER — SODIUM CHLORIDE (PF) 0.9 % IJ SOLN
INTRAMUSCULAR | Status: DC | PRN
  Administered 2020-04-01: 12 mL/h via EPIDURAL

## 2020-04-01 MED ORDER — PHENYLEPHRINE 40 MCG/ML (10ML) SYRINGE FOR IV PUSH (FOR BLOOD PRESSURE SUPPORT)
80.0000 ug | PREFILLED_SYRINGE | INTRAVENOUS | Status: DC | PRN
  Filled 2020-04-01: qty 10

## 2020-04-01 MED ORDER — DIPHENHYDRAMINE HCL 25 MG PO CAPS: 25.0000 mg | ORAL_CAPSULE | Freq: Four times a day (QID) | ORAL | Status: DC | PRN

## 2020-04-01 MED ORDER — TERBUTALINE SULFATE 1 MG/ML IJ SOLN: 0.2500 mg | Freq: Once | INTRAMUSCULAR | Status: DC | PRN

## 2020-04-01 MED ORDER — SENNOSIDES-DOCUSATE SODIUM 8.6-50 MG PO TABS
2.0000 | ORAL_TABLET | ORAL | Status: DC
  Administered 2020-04-01: 2 via ORAL
  Filled 2020-04-01: qty 2

## 2020-04-01 MED ORDER — OXYTOCIN-SODIUM CHLORIDE 30-0.9 UT/500ML-% IV SOLN: 2.5000 [IU]/h | INTRAVENOUS | Status: DC

## 2020-04-01 MED ORDER — LACTATED RINGERS IV SOLN
500.0000 mL | Freq: Once | INTRAVENOUS | Status: AC
  Administered 2020-04-01: 500 mL via INTRAVENOUS

## 2020-04-01 MED ORDER — FENTANYL-BUPIVACAINE-NACL 0.5-0.125-0.9 MG/250ML-% EP SOLN
12.0000 mL/h | EPIDURAL | Status: DC | PRN
  Filled 2020-04-01: qty 250

## 2020-04-01 MED ORDER — ZOLPIDEM TARTRATE 5 MG PO TABS: 5.0000 mg | ORAL_TABLET | Freq: Every evening | ORAL | Status: DC | PRN

## 2020-04-01 MED ORDER — PRENATAL MULTIVITAMIN CH
1.0000 | ORAL_TABLET | Freq: Every day | ORAL | Status: DC
  Administered 2020-04-01 – 2020-04-02 (×2): 1 via ORAL
  Filled 2020-04-01 (×2): qty 1

## 2020-04-01 MED ORDER — PHENYLEPHRINE 40 MCG/ML (10ML) SYRINGE FOR IV PUSH (FOR BLOOD PRESSURE SUPPORT): 80.0000 ug | PREFILLED_SYRINGE | INTRAVENOUS | Status: DC | PRN

## 2020-04-01 MED ORDER — IBUPROFEN 600 MG PO TABS
600.0000 mg | ORAL_TABLET | Freq: Four times a day (QID) | ORAL | Status: DC
  Administered 2020-04-01 – 2020-04-02 (×4): 600 mg via ORAL
  Filled 2020-04-01 (×4): qty 1

## 2020-04-01 MED ORDER — METHYLERGONOVINE MALEATE 0.2 MG/ML IJ SOLN: 0.2000 mg | INTRAMUSCULAR | Status: DC | PRN

## 2020-04-01 MED ORDER — DIPHENHYDRAMINE HCL 50 MG/ML IJ SOLN: 12.5000 mg | INTRAMUSCULAR | Status: DC | PRN

## 2020-04-01 MED ORDER — LIDOCAINE HCL (PF) 1 % IJ SOLN
INTRAMUSCULAR | Status: DC | PRN
  Administered 2020-04-01: 10 mL via EPIDURAL
  Administered 2020-04-01: 2 mL via EPIDURAL

## 2020-04-01 MED ORDER — DIBUCAINE (PERIANAL) 1 % EX OINT: 1.0000 "application " | TOPICAL_OINTMENT | CUTANEOUS | Status: DC | PRN

## 2020-04-01 MED ORDER — SIMETHICONE 80 MG PO CHEW: 80.0000 mg | CHEWABLE_TABLET | ORAL | Status: DC | PRN

## 2020-04-01 MED ORDER — LACTATED RINGERS IV SOLN: INTRAVENOUS | Status: DC

## 2020-04-01 MED ORDER — BENZOCAINE-MENTHOL 20-0.5 % EX AERO
1.0000 "application " | INHALATION_SPRAY | CUTANEOUS | Status: DC | PRN
  Administered 2020-04-01 – 2020-04-02 (×2): 1 via TOPICAL
  Filled 2020-04-01 (×2): qty 56

## 2020-04-01 MED ORDER — COCONUT OIL OIL: 1.0000 "application " | TOPICAL_OIL | Status: DC | PRN

## 2020-04-01 MED ORDER — ONDANSETRON HCL 4 MG/2ML IJ SOLN: 4.0000 mg | Freq: Four times a day (QID) | INTRAMUSCULAR | Status: DC | PRN

## 2020-04-01 MED ORDER — SOD CITRATE-CITRIC ACID 500-334 MG/5ML PO SOLN: 30.0000 mL | ORAL | Status: DC | PRN

## 2020-04-01 MED ORDER — OXYTOCIN BOLUS FROM INFUSION
333.0000 mL | Freq: Once | INTRAVENOUS | Status: AC
  Administered 2020-04-01: 333 mL via INTRAVENOUS

## 2020-04-01 NOTE — Anesthesia Procedure Notes (Signed)
Epidural Patient location during procedure: OB Start time: 04/01/2020 10:44 AM End time: 04/01/2020 10:55 AM  Staffing Anesthesiologist: Lannie Fields, DO Performed: anesthesiologist   Preanesthetic Checklist Completed: patient identified, IV checked, risks and benefits discussed, monitors and equipment checked, pre-op evaluation and timeout performed  Epidural Patient position: sitting Prep: DuraPrep and site prepped and draped Patient monitoring: continuous pulse ox, blood pressure, heart rate and cardiac monitor Approach: midline Location: L3-L4 Injection technique: LOR air  Needle:  Needle type: Tuohy  Needle gauge: 17 G Needle length: 9 cm Needle insertion depth: 4 cm Catheter type: closed end flexible Catheter size: 19 Gauge Catheter at skin depth: 9 cm Test dose: negative  Assessment Sensory level: T8 Events: blood not aspirated, injection not painful, no injection resistance, no paresthesia and negative IV test  Additional Notes Patient identified. Risks/Benefits/Options discussed with patient including but not limited to bleeding, infection, nerve damage, paralysis, failed block, incomplete pain control, headache, blood pressure changes, nausea, vomiting, reactions to medication both or allergic, itching and postpartum back pain. Confirmed with bedside nurse the patient's most recent platelet count. Confirmed with patient that they are not currently taking any anticoagulation, have any bleeding history or any family history of bleeding disorders. Patient expressed understanding and wished to proceed. All questions were answered. Sterile technique was used throughout the entire procedure. Please see nursing notes for vital signs. Test dose was given through epidural catheter and negative prior to continuing to dose epidural or start infusion. Warning signs of high block given to the patient including shortness of breath, tingling/numbness in hands, complete motor  block, or any concerning symptoms with instructions to call for help. Patient was given instructions on fall risk and not to get out of bed. All questions and concerns addressed with instructions to call with any issues or inadequate analgesia.  Reason for block:procedure for pain

## 2020-04-01 NOTE — Progress Notes (Signed)
S: Doing well. Feeling minor cramping. Husband, Will, at the bedside providing support. Discussed the R/B/A of AROM for induction of labor and pat consents to procedure.   O: Vitals:   04/01/20 0835 04/01/20 0850 04/01/20 0900  BP:   124/64  Pulse:   95  Resp:  18 18  Temp:  98.2 F (36.8 C)   TempSrc:  Oral   Weight: 66.4 kg    Height: 5\' 8"  (1.727 m)     FHT:  FHR: 140 bpm, variability: moderate,  accelerations:  Present,  decelerations:  Absent UC:   occasional SVE:   Dilation: 3.5 Effacement (%): 80 Station: -2 Exam by:: [email or web address], RN   AROM of clear fluid @ 0920  A / P: Induction of labor due to term with favorable cervix, Pitocin started  Fetal Wellbeing:  Category I Pain Control:  Labor support without medications Anticipated MOD:  NSVD  Dr. Earlene Plater updated on patient status.   Billy Coast, CNM, MSN 04/01/2020, 9:23 AM

## 2020-04-01 NOTE — Lactation Note (Signed)
This note was copied from a baby's chart. Lactation Consultation Note  Patient Name: Connie Lopez [id number] Date: 04/01/2020 Reason for consult: Initial assessment  Mom reports baby Connie Tessin is sleepy.  Now 4 hours old,  Fed really well past delivery. Mom reports her breastfeeding experience with her first was great.  She had no problems.  Weaned him during her first trimester.  Mom has a DEBP for home use if needed. Mom asked if LC could observe a feeding.  Mom reports it is just about time for her to eat.  Dad changed a poop and infant was still a little bubbly.  LC handed her to mom.  Mom attempted to latch her with great positioning and latch skills however Tessin would just hold moms  nipple in her mouth at this time. LC asked mom if she could assist as well.  Mom agreed. Attempted to lay mom back and do so some hand expression in Tessins mouth.  Tessin continued to hold nipple in mouth.  Praised breastfeeding.  Urged mom to call lactation as needed.  Left [facility] and urged mom to call lactation as needed.     Corbin Falck S Ryman Rathgeber 04/01/2020, 4:42 PM

## 2020-04-01 NOTE — H&P (Signed)
Connie Lopez is a 39 y.o. female presenting for history of precipitous labor for IOL. OB History    Gravida  5   Para  2   Term  2   Preterm      AB  3   Living  2     SAB  3   TAB      Ectopic      Multiple  0   Live Births  2          Past Medical History:  Diagnosis Date  . Migraines    Past Surgical History:  Procedure Laterality Date  . DILATION AND CURETTAGE OF UTERUS    . LASIK    . Lower GI    . TONSILLECTOMY    . TONSILLECTOMY AND ADENOIDECTOMY    . UPPER GI ENDOSCOPY    . WISDOM TOOTH EXTRACTION     Family History: family history includes Colon cancer in her maternal grandmother; Hypertension in her maternal grandfather and maternal grandmother; Lung cancer in her paternal grandfather. Social History:  reports that she has never smoked. She has never used smokeless tobacco. She reports that she does not drink alcohol and does not use drugs.     Maternal Diabetes: No Genetic Screening: Normal Maternal Ultrasounds/Referrals: Normal Fetal Ultrasounds or other Referrals:  None Maternal Substance Abuse:  No Significant Maternal Medications:  None Significant Maternal Lab Results:  Group B Strep negative Other Comments:  None  Review of Systems  Constitutional: Negative.   All other systems reviewed and are negative.  Maternal Medical History:  Reason for admission: Contractions.   Contractions: Onset was 3-5 hours ago.   Frequency: rare.   Perceived severity is mild.    Fetal activity: Perceived fetal activity is normal.   Last perceived fetal movement was within the past hour.    Prenatal complications: Preterm labor.   Prenatal Complications - Diabetes: none.    Dilation: 10 Effacement (%): 100 Station: Plus 2 Exam by:: Earlene Plater, RN  Blood pressure 109/71, pulse 61, temperature 97.8 F (36.6 C), temperature source Oral, resp. rate 16, height 5\' 8"  (1.727 m), weight 66.4 kg, SpO2 98 %, unknown if currently breastfeeding. Maternal  Exam:  Uterine Assessment: Contraction strength is mild.  Contraction frequency is irregular.   Abdomen: Patient reports no abdominal tenderness. Fetal presentation: vertex  Introitus: Normal vulva. Normal vagina.  Ferning test: not done.  Nitrazine test: not done. Amniotic fluid character: not assessed.  Pelvis: adequate for delivery.   Cervix: Cervix evaluated by digital exam.     Physical Exam Constitutional:      Appearance: Normal appearance. She is normal weight.  HENT:     Head: Normocephalic and atraumatic.  Cardiovascular:     Rate and Rhythm: Normal rate and regular rhythm.  Pulmonary:     Effort: Pulmonary effort is normal.     Breath sounds: Normal breath sounds.  Abdominal:     General: Bowel sounds are normal.     Palpations: Abdomen is soft.  Genitourinary:    General: Normal vulva.  Musculoskeletal:        General: Normal range of motion.     Cervical back: Normal range of motion and neck supple.  Skin:    General: Skin is warm and dry.  Neurological:     General: No focal deficit present.     Mental Status: She is alert and oriented to person, place, and time.  Psychiatric:  Mood and Affect: Mood normal.        Behavior: Behavior normal.     Prenatal labs: ABO, Rh: --/--/O POS (10/20 0998) Antibody: NEG (10/20 0834) Rubella: Immune (03/30 0000) RPR: NON REACTIVE (10/20 0834)  HBsAg: Negative (03/30 0000)  HIV: Non-reactive (03/30 0000)  GBS:   neg  Assessment/Plan: 39 wk Iup History of precipitous labor History of third degree laceration IOL   Letta Cargile J 04/01/2020, 3:15 PM

## 2020-04-01 NOTE — Anesthesia Preprocedure Evaluation (Signed)
Anesthesia Evaluation  Patient identified by MRN, date of birth, ID band Patient awake    Reviewed: Allergy & Precautions, NPO status , Patient's Chart, lab work & pertinent test results  Airway Mallampati: II  TM Distance: >3 FB Neck ROM: Full    Dental no notable dental hx.    Pulmonary neg pulmonary ROS,    Pulmonary exam normal breath sounds clear to auscultation       Cardiovascular negative cardio ROS Normal cardiovascular exam Rhythm:Regular Rate:Normal     Neuro/Psych  Headaches, negative psych ROS   GI/Hepatic negative GI ROS, Neg liver ROS,   Endo/Other  negative endocrine ROS  Renal/GU negative Renal ROS  negative genitourinary   Musculoskeletal negative musculoskeletal ROS (+)   Abdominal   Peds negative pediatric ROS (+)  Hematology negative hematology ROS (+) hct 44, plt 177   Anesthesia Other Findings   Reproductive/Obstetrics (+) Pregnancy 2nd baby, 1st epidural. Progressed very quickly last labor                             Anesthesia Physical Anesthesia Plan  ASA: II and emergent  Anesthesia Plan: Epidural   Post-op Pain Management:    Induction:   PONV Risk Score and Plan: 2  Airway Management Planned: Natural Airway  Additional Equipment: None  Intra-op Plan:   Post-operative Plan:   Informed Consent: I have reviewed the patients History and Physical, chart, labs and discussed the procedure including the risks, benefits and alternatives for the proposed anesthesia with the patient or authorized representative who has indicated his/her understanding and acceptance.       Plan Discussed with:   Anesthesia Plan Comments:         Anesthesia Quick Evaluation

## 2020-04-02 LAB — CBC
HCT: 35.8 % — ABNORMAL LOW (ref 36.0–46.0)
Hemoglobin: 12.1 g/dL (ref 12.0–15.0)
MCH: 33.5 pg (ref 26.0–34.0)
MCHC: 33.8 g/dL (ref 30.0–36.0)
MCV: 99.2 fL (ref 80.0–100.0)
Platelets: 151 10*3/uL (ref 150–400)
RBC: 3.61 MIL/uL — ABNORMAL LOW (ref 3.87–5.11)
RDW: 14.7 % (ref 11.5–15.5)
WBC: 9.5 10*3/uL (ref 4.0–10.5)
nRBC: 0 % (ref 0.0–0.2)

## 2020-04-02 MED ORDER — BENZOCAINE-MENTHOL 20-0.5 % EX AERO: 1.0000 "application " | INHALATION_SPRAY | CUTANEOUS | 1 refills | Status: DC | PRN

## 2020-04-02 MED ORDER — COCONUT OIL OIL: 1.0000 "application " | TOPICAL_OIL | 0 refills | Status: DC | PRN

## 2020-04-02 MED ORDER — IBUPROFEN 600 MG PO TABS
600.0000 mg | ORAL_TABLET | Freq: Four times a day (QID) | ORAL | 0 refills | Status: DC
Start: 2020-04-02 — End: 2023-02-21

## 2020-04-02 MED ORDER — ACETAMINOPHEN 325 MG PO TABS
650.0000 mg | ORAL_TABLET | ORAL | 1 refills | Status: DC | PRN
Start: 2020-04-02 — End: 2023-02-21

## 2020-04-02 NOTE — Discharge Summary (Signed)
OB Discharge Summary  Patient Name: Connie Lopez DOB: [birth year] MRN: [id number]  Date of admission: 04/01/2020 Delivering provider: Olivia Mackie   Admitting diagnosis: Encounter for induction of labor [Z34.90] Intrauterine pregnancy: [redacted]w[redacted]d     Secondary diagnosis: Patient Active Problem List   Diagnosis Date Noted   Encounter for induction of labor 04/01/2020   SVD (spontaneous vaginal delivery) 10/20 04/01/2020   Postpartum care following vaginal delivery 10/20 04/01/2020    Date of discharge: 04/02/2020   Discharge diagnosis: Principal Problem:   Postpartum care following vaginal delivery 10/20 Active Problems:   Encounter for induction of labor   SVD (spontaneous vaginal delivery) 10/20                                                      Post partum procedures:None  Augmentation: AROM and Pitocin Pain control: Epidural  Laceration:2nd degree  Episiotomy:None  Complications: None  Hospital course:  Induction of Labor With Vaginal Delivery   [name] at [redacted]w[redacted]d was admitted to the hospital 04/01/2020 for induction of labor.  Indication for induction: history of precipitous labor.  Patient had an uncomplicated labor course as follows: Membrane Rupture Time/Date: 9:20 AM ,04/01/2020   Delivery Method:Vaginal, Spontaneous  Episiotomy: None  Lacerations:  2nd degree  Details of delivery can be found in separate delivery note.  Patient had a routine postpartum course. Patient is discharged home 04/02/20.  Newborn Data: Birth date:04/01/2020  Birth time:11:44 AM  Gender:Female  Living status:Living  Apgars:9 ,9  Weight:3229 g   Physical exam  Vitals:   04/01/20 1532 04/01/20 1925 04/01/20 2318 04/02/20 0345  BP: 108/63 112/65 103/67 105/69  Pulse: 69 74 70 67  Resp: 16 16 16 16   Temp: 98.1 F (36.7 C) 98.3 F (36.8 C) 97.6 F (36.4 C) 97.8 F (36.6 C)  TempSrc: Oral Oral    SpO2:      Weight:      Height:       General: alert, cooperative and no  distress Lochia: appropriate Uterine Fundus: firm Perineum: repair intact, no edema DVT Evaluation: No evidence of DVT seen on physical exam. No significant calf/ankle edema. Labs: Lab Results  Component Value Date   WBC 9.5 04/02/2020   HGB 12.1 04/02/2020   HCT 35.8 (L) 04/02/2020   MCV 99.2 04/02/2020   PLT 151 04/02/2020   No flowsheet data found. Edinburgh Postnatal Depression Scale Screening Tool 04/01/2020 07/08/2017  I have been able to laugh and see the funny side of things. 0 0  I have looked forward with enjoyment to things. 0 0  I have blamed myself unnecessarily when things went wrong. 0 0  I have been anxious or worried for no good reason. 0 0  I have felt scared or panicky for no good reason. 0 1  Things have been getting on top of me. 0 0  I have been so unhappy that I have had difficulty sleeping. 0 0  I have felt sad or miserable. 0 0  I have been so unhappy that I have been crying. 0 0  The thought of harming myself has occurred to me. 0 0  Edinburgh Postnatal Depression Scale Total 0 1    Vaccines: TDaP UTD         Flu    At discharge  COVID-19   UTD  Discharge instructions:  per After Visit Summary and [facility] booklet  After Visit Meds:  Allergies as of 04/02/2020      Reactions   Penicillins Rash   Childhood reaction Has patient had a PCN reaction causing immediate rash, facial/tongue/throat swelling, SOB or lightheadedness with hypotension: Yes Has patient had a PCN reaction causing severe rash involving mucus membranes or skin necrosis: No Has patient had a PCN reaction that required hospitalization: No Has patient had a PCN reaction occurring within the last 10 years: No If all of the above answers are "NO", then may proceed with Cephalosporin use.      Medication List    STOP taking these medications   COLACE PO   magnesium oxide 400 (241.3 Mg) MG tablet Commonly known as: MAG-OX   valACYclovir 1000 MG tablet Commonly known  as: VALTREX     TAKE these medications   acetaminophen 325 MG tablet Commonly known as: Tylenol Take 2 tablets (650 mg total) by mouth every 4 (four) hours as needed (for pain scale < 4). What changed:   medication strength  how much to take  when to take this  reasons to take this   benzocaine-Menthol 20-0.5 % Aero Commonly known as: DERMOPLAST Apply 1 application topically as needed for irritation (perineal discomfort).   coconut oil Oil Apply 1 application topically as needed.   ibuprofen 600 MG tablet Commonly known as: ADVIL Take 1 tablet (600 mg total) by mouth every 6 (six) hours.   PRENATAL VITAMINS PO Take 1 tablet by mouth daily.            Discharge Care Instructions  (From admission, onward)         Start     Ordered   04/02/20 0000  Discharge wound care:       Comments: Sitz baths 2 times /day with warm water x 1 week. May add herbals: 1 ounce dried comfrey leaf* 1 ounce calendula flowers 1 ounce lavender flowers  Supplies can be found online at [employer] sources at [employer], [employer]  1/2 ounce dried uva ursi leaves 1/2 ounce witch hazel blossoms (if you can find them) 1/2 ounce dried sage leaf 1/2 cup sea salt Directions: Bring 2 quarts of water to a boil. Turn off heat, and place 1 ounce (approximately 1 large handful) of the above mixed herbs (not the salt) into the pot. Steep, covered, for 30 minutes.  Strain the liquid well with a fine mesh strainer, and discard the herb material. Add 2 quarts of liquid to the tub, along with the 1/2 cup of salt. This medicinal liquid can also be made into compresses and peri-rinses.   04/02/20 1035         Diet: routine diet  Activity: Advance as tolerated. Pelvic rest for 6 weeks.   Newborn Data: Live born female  Birth Weight: 7 lb 1.9 oz (3229 g) APGAR: 9, 9  Newborn Delivery   Birth date/time: 04/01/2020 11:44:00 Delivery type: Vaginal, Spontaneous     Named  Tessin Baby Feeding: Breast Disposition:home with mother  Delivery Report:  Review the Delivery Report for details.    Follow up:  Follow-up Information    Olivia Mackie, MD. Schedule an appointment as soon as possible for a visit in 6 week(s).   Specialty: Obstetrics and Gynecology Why: Please make an appointment for 6 weeks postpartum.  Contact information: [address] [place] [address] [phone]  June Leap, CNM, MSN 04/02/2020, 10:35 AM

## 2020-04-02 NOTE — Anesthesia Postprocedure Evaluation (Signed)
Anesthesia Post Note  Patient: Briannah Lona  Procedure(s) Performed: AN AD HOC LABOR EPIDURAL     Patient location during evaluation: Mother Baby Anesthesia Type: Epidural Level of consciousness: awake and alert and oriented Pain management: satisfactory to patient Vital Signs Assessment: post-procedure vital signs reviewed and stable Respiratory status: respiratory function stable Cardiovascular status: stable Postop Assessment: no headache, no backache, epidural receding, patient able to bend at knees, no signs of nausea or vomiting, adequate PO intake and able to ambulate Anesthetic complications: no   No complications documented.  Last Vitals:  Vitals:   04/01/20 2318 04/02/20 0345  BP: 103/67 105/69  Pulse: 70 67  Resp: 16 16  Temp: 36.4 C 36.6 C  SpO2:      Last Pain:  Vitals:   04/02/20 0722  TempSrc:   PainSc: 1    Pain Goal:                   Dashawn Golda

## 2020-04-06 ENCOUNTER — Inpatient Hospital Stay (HOSPITAL_COMMUNITY): Admit: 2020-04-06 | Payer: Self-pay

## 2020-05-13 DIAGNOSIS — Z01419 Encounter for gynecological examination (general) (routine) without abnormal findings: Secondary | ICD-10-CM | POA: Diagnosis not present

## 2020-07-23 DIAGNOSIS — R61 Generalized hyperhidrosis: Secondary | ICD-10-CM | POA: Diagnosis not present

## 2020-10-19 DIAGNOSIS — M79675 Pain in left toe(s): Secondary | ICD-10-CM | POA: Diagnosis not present

## 2020-12-09 DIAGNOSIS — Z20822 Contact with and (suspected) exposure to covid-19: Secondary | ICD-10-CM | POA: Diagnosis not present

## 2020-12-17 DIAGNOSIS — Z20822 Contact with and (suspected) exposure to covid-19: Secondary | ICD-10-CM | POA: Diagnosis not present

## 2021-02-19 DIAGNOSIS — Z0000 Encounter for general adult medical examination without abnormal findings: Secondary | ICD-10-CM | POA: Diagnosis not present

## 2021-02-24 DIAGNOSIS — M8580 Other specified disorders of bone density and structure, unspecified site: Secondary | ICD-10-CM | POA: Diagnosis not present

## 2021-02-24 DIAGNOSIS — Z1389 Encounter for screening for other disorder: Secondary | ICD-10-CM | POA: Diagnosis not present

## 2021-02-24 DIAGNOSIS — Z79899 Other long term (current) drug therapy: Secondary | ICD-10-CM | POA: Diagnosis not present

## 2021-02-24 DIAGNOSIS — Z1331 Encounter for screening for depression: Secondary | ICD-10-CM | POA: Diagnosis not present

## 2021-02-24 DIAGNOSIS — Z0000 Encounter for general adult medical examination without abnormal findings: Secondary | ICD-10-CM | POA: Diagnosis not present

## 2021-03-30 DIAGNOSIS — D485 Neoplasm of uncertain behavior of skin: Secondary | ICD-10-CM | POA: Diagnosis not present

## 2021-03-30 DIAGNOSIS — D225 Melanocytic nevi of trunk: Secondary | ICD-10-CM | POA: Diagnosis not present

## 2021-03-30 DIAGNOSIS — D2262 Melanocytic nevi of left upper limb, including shoulder: Secondary | ICD-10-CM | POA: Diagnosis not present

## 2021-03-30 DIAGNOSIS — L718 Other rosacea: Secondary | ICD-10-CM | POA: Diagnosis not present

## 2021-03-30 DIAGNOSIS — D2261 Melanocytic nevi of right upper limb, including shoulder: Secondary | ICD-10-CM | POA: Diagnosis not present

## 2021-03-30 DIAGNOSIS — L603 Nail dystrophy: Secondary | ICD-10-CM | POA: Diagnosis not present

## 2021-05-25 DIAGNOSIS — Z01419 Encounter for gynecological examination (general) (routine) without abnormal findings: Secondary | ICD-10-CM | POA: Diagnosis not present

## 2021-05-25 DIAGNOSIS — Z681 Body mass index (BMI) 19 or less, adult: Secondary | ICD-10-CM | POA: Diagnosis not present

## 2021-05-25 DIAGNOSIS — Z124 Encounter for screening for malignant neoplasm of cervix: Secondary | ICD-10-CM | POA: Diagnosis not present

## 2021-05-25 DIAGNOSIS — Z01411 Encounter for gynecological examination (general) (routine) with abnormal findings: Secondary | ICD-10-CM | POA: Diagnosis not present

## 2021-07-27 DIAGNOSIS — I8311 Varicose veins of right lower extremity with inflammation: Secondary | ICD-10-CM | POA: Diagnosis not present

## 2021-07-27 DIAGNOSIS — I8312 Varicose veins of left lower extremity with inflammation: Secondary | ICD-10-CM | POA: Diagnosis not present

## 2021-08-11 DIAGNOSIS — I8311 Varicose veins of right lower extremity with inflammation: Secondary | ICD-10-CM | POA: Diagnosis not present

## 2021-08-11 DIAGNOSIS — I8312 Varicose veins of left lower extremity with inflammation: Secondary | ICD-10-CM | POA: Diagnosis not present

## 2021-08-25 DIAGNOSIS — I8311 Varicose veins of right lower extremity with inflammation: Secondary | ICD-10-CM | POA: Diagnosis not present

## 2021-08-27 DIAGNOSIS — I8312 Varicose veins of left lower extremity with inflammation: Secondary | ICD-10-CM | POA: Diagnosis not present

## 2021-09-08 DIAGNOSIS — I8312 Varicose veins of left lower extremity with inflammation: Secondary | ICD-10-CM | POA: Diagnosis not present

## 2021-09-14 DIAGNOSIS — H1045 Other chronic allergic conjunctivitis: Secondary | ICD-10-CM | POA: Diagnosis not present

## 2021-09-14 DIAGNOSIS — J3089 Other allergic rhinitis: Secondary | ICD-10-CM | POA: Diagnosis not present

## 2021-09-14 DIAGNOSIS — T781XXD Other adverse food reactions, not elsewhere classified, subsequent encounter: Secondary | ICD-10-CM | POA: Diagnosis not present

## 2021-09-16 DIAGNOSIS — T781XXA Other adverse food reactions, not elsewhere classified, initial encounter: Secondary | ICD-10-CM | POA: Diagnosis not present

## 2021-10-12 DIAGNOSIS — I8312 Varicose veins of left lower extremity with inflammation: Secondary | ICD-10-CM | POA: Diagnosis not present

## 2021-10-29 DIAGNOSIS — I8312 Varicose veins of left lower extremity with inflammation: Secondary | ICD-10-CM | POA: Diagnosis not present

## 2021-11-05 DIAGNOSIS — I8312 Varicose veins of left lower extremity with inflammation: Secondary | ICD-10-CM | POA: Diagnosis not present

## 2021-11-18 DIAGNOSIS — I8312 Varicose veins of left lower extremity with inflammation: Secondary | ICD-10-CM | POA: Diagnosis not present

## 2021-11-18 DIAGNOSIS — Z309 Encounter for contraceptive management, unspecified: Secondary | ICD-10-CM | POA: Diagnosis not present

## 2022-01-14 DIAGNOSIS — Z3689 Encounter for other specified antenatal screening: Secondary | ICD-10-CM | POA: Diagnosis not present

## 2022-01-14 DIAGNOSIS — Z3201 Encounter for pregnancy test, result positive: Secondary | ICD-10-CM | POA: Diagnosis not present

## 2022-01-14 DIAGNOSIS — Z3200 Encounter for pregnancy test, result unknown: Secondary | ICD-10-CM | POA: Diagnosis not present

## 2022-01-18 DIAGNOSIS — Z8759 Personal history of other complications of pregnancy, childbirth and the puerperium: Secondary | ICD-10-CM | POA: Diagnosis not present

## 2022-01-20 DIAGNOSIS — Z8759 Personal history of other complications of pregnancy, childbirth and the puerperium: Secondary | ICD-10-CM | POA: Diagnosis not present

## 2022-02-03 DIAGNOSIS — Z3201 Encounter for pregnancy test, result positive: Secondary | ICD-10-CM | POA: Diagnosis not present

## 2022-02-23 DIAGNOSIS — Z3689 Encounter for other specified antenatal screening: Secondary | ICD-10-CM | POA: Diagnosis not present

## 2022-02-23 DIAGNOSIS — Z3480 Encounter for supervision of other normal pregnancy, unspecified trimester: Secondary | ICD-10-CM | POA: Diagnosis not present

## 2022-02-23 DIAGNOSIS — O09529 Supervision of elderly multigravida, unspecified trimester: Secondary | ICD-10-CM | POA: Diagnosis not present

## 2022-02-23 DIAGNOSIS — Z3A09 9 weeks gestation of pregnancy: Secondary | ICD-10-CM | POA: Diagnosis not present

## 2022-02-23 DIAGNOSIS — Z8759 Personal history of other complications of pregnancy, childbirth and the puerperium: Secondary | ICD-10-CM | POA: Diagnosis not present

## 2022-02-28 DIAGNOSIS — Z3689 Encounter for other specified antenatal screening: Secondary | ICD-10-CM | POA: Diagnosis not present

## 2022-02-28 DIAGNOSIS — O09521 Supervision of elderly multigravida, first trimester: Secondary | ICD-10-CM | POA: Diagnosis not present

## 2022-04-28 DIAGNOSIS — Z1231 Encounter for screening mammogram for malignant neoplasm of breast: Secondary | ICD-10-CM | POA: Diagnosis not present

## 2022-05-03 DIAGNOSIS — H04123 Dry eye syndrome of bilateral lacrimal glands: Secondary | ICD-10-CM | POA: Diagnosis not present

## 2022-05-17 DIAGNOSIS — I83892 Varicose veins of left lower extremities with other complications: Secondary | ICD-10-CM | POA: Diagnosis not present

## 2022-05-18 ENCOUNTER — Ambulatory Visit: Payer: BC Managed Care – PPO | Admitting: Podiatry

## 2022-05-21 DIAGNOSIS — L03319 Cellulitis of trunk, unspecified: Secondary | ICD-10-CM | POA: Diagnosis not present

## 2022-05-24 ENCOUNTER — Ambulatory Visit (INDEPENDENT_AMBULATORY_CARE_PROVIDER_SITE_OTHER): Payer: BC Managed Care – PPO | Admitting: Podiatry

## 2022-05-24 VITALS — BP 124/68

## 2022-05-24 DIAGNOSIS — L600 Ingrowing nail: Secondary | ICD-10-CM

## 2022-05-24 DIAGNOSIS — B351 Tinea unguium: Secondary | ICD-10-CM | POA: Diagnosis not present

## 2022-05-24 DIAGNOSIS — L603 Nail dystrophy: Secondary | ICD-10-CM | POA: Diagnosis not present

## 2022-05-24 NOTE — Progress Notes (Signed)
Subjective:   Patient ID: Connie Lopez, female   DOB: 41 y.o.   MRN: [id number]   HPI Chief Complaint  Patient presents with   Nail Problem    Right hallux nail fungus and is coming off per patient    41 year old female presents the office with above concerns.  She states that she went to [place] about 5 years ago and afterwards the nail turned black and since then she has had a fungus.  She states that now the nail is not attached.  Does cause discomfort.  No swelling redness or any drainage.  No recent treatment.  No other concerns today.     Review of Systems  All other systems reviewed and are negative.  Past Medical History:  Diagnosis Date   Migraines     Past Surgical History:  Procedure Laterality Date   DILATION AND CURETTAGE OF UTERUS     LASIK     Lower GI     TONSILLECTOMY     TONSILLECTOMY AND ADENOIDECTOMY     UPPER GI ENDOSCOPY     WISDOM TOOTH EXTRACTION       Current Outpatient Medications:    acetaminophen (TYLENOL) 325 MG tablet, Take 2 tablets (650 mg total) by mouth every 4 (four) hours as needed (for pain scale < 4)., Disp: 30 tablet, Rfl: 1   benzocaine-Menthol (DERMOPLAST) 20-0.5 % AERO, Apply 1 application topically as needed for irritation (perineal discomfort)., Disp: 56 g, Rfl: 1   coconut oil OIL, Apply 1 application topically as needed., Disp: , Rfl: 0   ibuprofen (ADVIL) 600 MG tablet, Take 1 tablet (600 mg total) by mouth every 6 (six) hours., Disp: 30 tablet, Rfl: 0   Prenatal Multivit-Min-Fe-FA (PRENATAL VITAMINS PO), Take 1 tablet by mouth daily. , Disp: , Rfl:   Allergies  Allergen Reactions   Penicillins Rash    Childhood reaction Has patient had a PCN reaction causing immediate rash, facial/tongue/throat swelling, SOB or lightheadedness with hypotension: Yes Has patient had a PCN reaction causing severe rash involving mucus membranes or skin necrosis: No Has patient had a PCN reaction that required hospitalization: No Has patient  had a PCN reaction occurring within the last 10 years: No If all of the above answers are "NO", then may proceed with Cephalosporin use.           Objective:  Physical Exam  General: AAO x3, NAD  Dermatological: The right hallux nails very loose and only attached along the proximal medial nail border is getting ingrown along the corner.  It does appear there is a new nail starting to grow in.  There is no drainage or pus.  No open lesions otherwise.  Vascular: Dorsalis Pedis artery and Posterior Tibial artery pedal pulses are 2/4 bilateral with immedate capillary fill time.  There is no pain with calf compression, swelling, warmth, erythema.   Neruologic: Grossly intact via light touch bilateral.   Musculoskeletal: No gross boney pedal deformities bilateral. No pain, crepitus, or limitation noted with foot and ankle range of motion bilateral. Muscular strength 5/5 in all groups tested bilateral.  Gait: Unassisted, Nonantalgic.       Assessment:   Right hallux onycholysis, ingrown toenail     Plan:  -Treatment options discussed including all alternatives, risks, and complications -Etiology of symptoms were discussed -Today I discussed with her removal of the toenail mostly not adhered we discussed removing the nail.  Understand risks complications she wishes to proceed and consent was signed.  Skin was cleaned alcohol and mixture of lidocaine, Marcaine plain in a digital block fashion.  Once anesthetized skin was prepped with Betadine and tourniquet applied.  Was able to remove the nail.  It was getting ingrown medial nail border and I was able to remove this.  It does appear there is a new nail starting to grow in.  I irrigated with alcohol.  Antibiotic ointment and dressing applied.  Tourniquet was released and there was an immediate cap refill time to the digit.  Tolerated procedure well any complications. -Nail sent for culture  Vivi Barrack DPM

## 2022-05-24 NOTE — Patient Instructions (Signed)

## 2022-05-27 ENCOUNTER — Telehealth: Payer: Self-pay | Admitting: *Deleted

## 2022-05-27 ENCOUNTER — Other Ambulatory Visit: Payer: Self-pay | Admitting: Podiatry

## 2022-05-27 MED ORDER — SULFAMETHOXAZOLE-TRIMETHOPRIM 800-160 MG PO TABS: 1.0000 | ORAL_TABLET | Freq: Two times a day (BID) | ORAL | 0 refills | Status: DC

## 2022-05-27 NOTE — Telephone Encounter (Addendum)
Patient is calling because her post procedural toe is red, swollen  around the toe joint and borde no pus noticed, or streaking up into the foot,is still soaking the toe but seems to be getting worse instead of better. Please advise.

## 2022-05-30 NOTE — Telephone Encounter (Signed)
Called patient to update that a medication had been sent to pharmacy, was aware but she used hibiclen antiseptic(a previous prescription) worked wonders, feels,looks ,better,little sore but over all better, can walk on it now.

## 2022-06-13 NOTE — L&D Delivery Note (Addendum)
Delivery Note At 12:24 PM a viable and healthy female was delivered via Vaginal, Spontaneous (Presentation: Left Occiput Anterior).  APGAR: 9, 9; weight  pending.   Placenta status: Manual removal, Intact.  Cord: 3 vessels with the following complications: None.  Cord pH: na  Anesthesia: Epidural Episiotomy: None Lacerations: 2nd degree;Perineal Suture Repair: 2.0 vicryl rapide Est. Blood Loss (mL):  52  Mom to postpartum.  Baby to Couplet care / Skin to Skin.  Shandora Koogler J 02/20/2023, 12:55 PM

## 2022-06-21 DIAGNOSIS — I83892 Varicose veins of left lower extremities with other complications: Secondary | ICD-10-CM | POA: Diagnosis not present

## 2022-06-23 ENCOUNTER — Ambulatory Visit (INDEPENDENT_AMBULATORY_CARE_PROVIDER_SITE_OTHER): Payer: BC Managed Care – PPO | Admitting: Podiatry

## 2022-06-23 DIAGNOSIS — Z3200 Encounter for pregnancy test, result unknown: Secondary | ICD-10-CM | POA: Diagnosis not present

## 2022-06-23 DIAGNOSIS — B351 Tinea unguium: Secondary | ICD-10-CM | POA: Diagnosis not present

## 2022-06-23 DIAGNOSIS — Z8759 Personal history of other complications of pregnancy, childbirth and the puerperium: Secondary | ICD-10-CM | POA: Diagnosis not present

## 2022-06-23 NOTE — Progress Notes (Signed)
Subjective: Chief Complaint  Patient presents with   Follow-up    3 wk toe nail fungus, patient is [redacted] weeks pregnant, laser was requested    42 year old female presents the office with above concerns.  She is hopeful to start the laser treatment for nail fungus.  Given her recent pregnancy we will hold off on antifungal medications.  No pain with the nail.  Objective: AAO x3, NAD DP/PT pulses palpable bilaterally, CRT less than 3 seconds It appears the nail is starting to grow back again and that there is yellow discoloration noted.  No edema, erythema or signs of infection. No pain with calf compression, swelling, warmth, erythema  Assessment: Onychomycosis  Plan: -All treatment options discussed with the patient including all alternatives, risks, complications.  -We reviewed the nail culture results.  Which did show onychomycosis, Pseudomonas.  Does not appear to have Pseudomonas and I think this is treated with nail removal, soaking and antibiotics. We did start treatment for nail fungus but will hold off on medications given her pregnancy.  She underwent her first treatment of laser therapy today without any complications following standard precautions and eye protection. Laser was completd by Isidore Moos, CMA. -Patient encouraged to call the office with any questions, concerns, change in symptoms.   Trula Slade DPM

## 2022-06-27 DIAGNOSIS — Z8759 Personal history of other complications of pregnancy, childbirth and the puerperium: Secondary | ICD-10-CM | POA: Diagnosis not present

## 2022-07-19 DIAGNOSIS — Z3201 Encounter for pregnancy test, result positive: Secondary | ICD-10-CM | POA: Diagnosis not present

## 2022-07-21 ENCOUNTER — Other Ambulatory Visit: Payer: BC Managed Care – PPO

## 2022-07-26 ENCOUNTER — Ambulatory Visit (INDEPENDENT_AMBULATORY_CARE_PROVIDER_SITE_OTHER): Payer: BC Managed Care – PPO

## 2022-07-26 DIAGNOSIS — B351 Tinea unguium: Secondary | ICD-10-CM

## 2022-07-26 NOTE — Progress Notes (Signed)
Patient presents today for the 2ndd laser treatment. Diagnosed with mycotic nail infection by Dr. Jacqualyn Posey.   Toenail most affected Right Hallux.  All other systems are negative.  Nails were filed thin. Laser therapy was administered to Right toenails 1 and patient tolerated the treatment well. All safety precautions were in place.     Follow up in 4 weeks for laser # 3.

## 2022-08-02 DIAGNOSIS — O09521 Supervision of elderly multigravida, first trimester: Secondary | ICD-10-CM | POA: Diagnosis not present

## 2022-08-02 DIAGNOSIS — Z36 Encounter for antenatal screening for chromosomal anomalies: Secondary | ICD-10-CM | POA: Diagnosis not present

## 2022-08-02 DIAGNOSIS — Z3689 Encounter for other specified antenatal screening: Secondary | ICD-10-CM | POA: Diagnosis not present

## 2022-08-02 DIAGNOSIS — Z8759 Personal history of other complications of pregnancy, childbirth and the puerperium: Secondary | ICD-10-CM | POA: Diagnosis not present

## 2022-08-02 DIAGNOSIS — Z3A10 10 weeks gestation of pregnancy: Secondary | ICD-10-CM | POA: Diagnosis not present

## 2022-08-02 DIAGNOSIS — Z3481 Encounter for supervision of other normal pregnancy, first trimester: Secondary | ICD-10-CM | POA: Diagnosis not present

## 2022-08-02 LAB — OB RESULTS CONSOLE HEPATITIS B SURFACE ANTIGEN: Hepatitis B Surface Ag: NEGATIVE

## 2022-08-02 LAB — OB RESULTS CONSOLE HIV ANTIBODY (ROUTINE TESTING): HIV: NONREACTIVE

## 2022-08-02 LAB — OB RESULTS CONSOLE RPR: RPR: NONREACTIVE

## 2022-08-02 LAB — OB RESULTS CONSOLE RUBELLA ANTIBODY, IGM: Rubella: IMMUNE

## 2022-08-18 DIAGNOSIS — Z118 Encounter for screening for other infectious and parasitic diseases: Secondary | ICD-10-CM | POA: Diagnosis not present

## 2022-08-18 DIAGNOSIS — Z36 Encounter for antenatal screening for chromosomal anomalies: Secondary | ICD-10-CM | POA: Diagnosis not present

## 2022-08-18 LAB — OB RESULTS CONSOLE GC/CHLAMYDIA
Chlamydia: NEGATIVE
Neisseria Gonorrhea: NEGATIVE

## 2022-08-30 ENCOUNTER — Ambulatory Visit (INDEPENDENT_AMBULATORY_CARE_PROVIDER_SITE_OTHER): Payer: BC Managed Care – PPO

## 2022-08-30 DIAGNOSIS — B351 Tinea unguium: Secondary | ICD-10-CM

## 2022-08-30 NOTE — Progress Notes (Signed)
Patient presents today for the 3rd laser treatment. Diagnosed with mycotic nail infection by Dr. Jacqualyn Posey.   Toenail most affected Right Hallux.  All other systems are negative.  Nails were filed thin. Laser therapy was administered to Right toenails 1 and patient tolerated the treatment well. All safety precautions were in place.     Follow up in 6 weeks for laser # 4.

## 2022-09-13 DIAGNOSIS — Z361 Encounter for antenatal screening for raised alphafetoprotein level: Secondary | ICD-10-CM | POA: Diagnosis not present

## 2022-10-04 DIAGNOSIS — O09522 Supervision of elderly multigravida, second trimester: Secondary | ICD-10-CM | POA: Diagnosis not present

## 2022-10-04 DIAGNOSIS — Z3A19 19 weeks gestation of pregnancy: Secondary | ICD-10-CM | POA: Diagnosis not present

## 2022-11-23 DIAGNOSIS — Z3689 Encounter for other specified antenatal screening: Secondary | ICD-10-CM | POA: Diagnosis not present

## 2022-11-23 DIAGNOSIS — O09522 Supervision of elderly multigravida, second trimester: Secondary | ICD-10-CM | POA: Diagnosis not present

## 2022-11-23 DIAGNOSIS — Z3A26 26 weeks gestation of pregnancy: Secondary | ICD-10-CM | POA: Diagnosis not present

## 2022-11-23 DIAGNOSIS — O26879 Cervical shortening, unspecified trimester: Secondary | ICD-10-CM | POA: Diagnosis not present

## 2022-12-07 DIAGNOSIS — Z3A28 28 weeks gestation of pregnancy: Secondary | ICD-10-CM | POA: Diagnosis not present

## 2022-12-07 DIAGNOSIS — Z3689 Encounter for other specified antenatal screening: Secondary | ICD-10-CM | POA: Diagnosis not present

## 2022-12-07 DIAGNOSIS — O26873 Cervical shortening, third trimester: Secondary | ICD-10-CM | POA: Diagnosis not present

## 2022-12-20 DIAGNOSIS — O09523 Supervision of elderly multigravida, third trimester: Secondary | ICD-10-CM | POA: Diagnosis not present

## 2022-12-20 DIAGNOSIS — Z3A30 30 weeks gestation of pregnancy: Secondary | ICD-10-CM | POA: Diagnosis not present

## 2023-01-09 DIAGNOSIS — Z23 Encounter for immunization: Secondary | ICD-10-CM | POA: Diagnosis not present

## 2023-01-25 DIAGNOSIS — O09523 Supervision of elderly multigravida, third trimester: Secondary | ICD-10-CM | POA: Diagnosis not present

## 2023-01-25 DIAGNOSIS — Z3685 Encounter for antenatal screening for Streptococcus B: Secondary | ICD-10-CM | POA: Diagnosis not present

## 2023-01-25 DIAGNOSIS — Z3A35 35 weeks gestation of pregnancy: Secondary | ICD-10-CM | POA: Diagnosis not present

## 2023-01-25 LAB — OB RESULTS CONSOLE GBS: GBS: NEGATIVE

## 2023-02-02 DIAGNOSIS — Z3A36 36 weeks gestation of pregnancy: Secondary | ICD-10-CM | POA: Diagnosis not present

## 2023-02-02 DIAGNOSIS — O09523 Supervision of elderly multigravida, third trimester: Secondary | ICD-10-CM | POA: Diagnosis not present

## 2023-02-09 DIAGNOSIS — Z3A37 37 weeks gestation of pregnancy: Secondary | ICD-10-CM | POA: Diagnosis not present

## 2023-02-09 DIAGNOSIS — O09523 Supervision of elderly multigravida, third trimester: Secondary | ICD-10-CM | POA: Diagnosis not present

## 2023-02-14 DIAGNOSIS — Z8759 Personal history of other complications of pregnancy, childbirth and the puerperium: Secondary | ICD-10-CM | POA: Diagnosis not present

## 2023-02-14 DIAGNOSIS — Z3A38 38 weeks gestation of pregnancy: Secondary | ICD-10-CM | POA: Diagnosis not present

## 2023-02-15 ENCOUNTER — Other Ambulatory Visit: Payer: Self-pay | Admitting: Obstetrics and Gynecology

## 2023-02-16 ENCOUNTER — Telehealth (HOSPITAL_COMMUNITY): Payer: Self-pay | Admitting: *Deleted

## 2023-02-16 ENCOUNTER — Encounter (HOSPITAL_COMMUNITY): Payer: Self-pay

## 2023-02-16 ENCOUNTER — Encounter (HOSPITAL_COMMUNITY): Payer: Self-pay | Admitting: *Deleted

## 2023-02-16 NOTE — Telephone Encounter (Signed)
Preadmission screen  

## 2023-02-17 ENCOUNTER — Telehealth (HOSPITAL_COMMUNITY): Payer: Self-pay | Admitting: *Deleted

## 2023-02-17 ENCOUNTER — Encounter (HOSPITAL_COMMUNITY): Payer: Self-pay | Admitting: *Deleted

## 2023-02-17 NOTE — Telephone Encounter (Signed)
Preadmission screen  

## 2023-02-20 ENCOUNTER — Other Ambulatory Visit: Payer: Self-pay

## 2023-02-20 ENCOUNTER — Inpatient Hospital Stay (HOSPITAL_COMMUNITY)
Admission: AD | Admit: 2023-02-20 | Discharge: 2023-02-21 | DRG: 807 | Disposition: A | Discharge status: Home / Self Care | Payer: BC Managed Care – PPO | Attending: Obstetrics and Gynecology | Admitting: Obstetrics and Gynecology

## 2023-02-20 ENCOUNTER — Inpatient Hospital Stay (HOSPITAL_COMMUNITY): Payer: BC Managed Care – PPO | Admitting: Anesthesiology

## 2023-02-20 ENCOUNTER — Encounter (HOSPITAL_COMMUNITY): Payer: Self-pay | Admitting: Obstetrics and Gynecology

## 2023-02-20 ENCOUNTER — Inpatient Hospital Stay (HOSPITAL_COMMUNITY): Payer: BC Managed Care – PPO

## 2023-02-20 DIAGNOSIS — Z3A39 39 weeks gestation of pregnancy: Secondary | ICD-10-CM | POA: Diagnosis not present

## 2023-02-20 DIAGNOSIS — Z3490 Encounter for supervision of normal pregnancy, unspecified, unspecified trimester: Secondary | ICD-10-CM | POA: Diagnosis present

## 2023-02-20 DIAGNOSIS — Z412 Encounter for routine and ritual male circumcision: Secondary | ICD-10-CM | POA: Diagnosis not present

## 2023-02-20 DIAGNOSIS — O26893 Other specified pregnancy related conditions, third trimester: Principal | ICD-10-CM | POA: Diagnosis present

## 2023-02-20 DIAGNOSIS — Z8249 Family history of ischemic heart disease and other diseases of the circulatory system: Secondary | ICD-10-CM

## 2023-02-20 LAB — CBC
HCT: 40.3 % (ref 36.0–46.0)
Hemoglobin: 13.6 g/dL (ref 12.0–15.0)
MCH: 32.2 pg (ref 26.0–34.0)
MCHC: 33.7 g/dL (ref 30.0–36.0)
MCV: 95.5 fL (ref 80.0–100.0)
Platelets: 163 10*3/uL (ref 150–400)
RBC: 4.22 MIL/uL (ref 3.87–5.11)
RDW: 14.4 % (ref 11.5–15.5)
WBC: 7 10*3/uL (ref 4.0–10.5)
nRBC: 0 % (ref 0.0–0.2)

## 2023-02-20 LAB — TYPE AND SCREEN
ABO/RH(D): O POS
Antibody Screen: NEGATIVE

## 2023-02-20 LAB — RPR: RPR Ser Ql: NONREACTIVE

## 2023-02-20 MED ORDER — EPHEDRINE 5 MG/ML INJ: 10.0000 mg | INTRAVENOUS | Status: DC | PRN

## 2023-02-20 MED ORDER — OXYTOCIN BOLUS FROM INFUSION
333.0000 mL | Freq: Once | INTRAVENOUS | Status: AC
  Administered 2023-02-20: 333 mL via INTRAVENOUS

## 2023-02-20 MED ORDER — LACTATED RINGERS IV SOLN: INTRAVENOUS | Status: DC

## 2023-02-20 MED ORDER — LACTATED RINGERS IV SOLN: 500.0000 mL | INTRAVENOUS | Status: DC | PRN

## 2023-02-20 MED ORDER — ONDANSETRON HCL 4 MG/2ML IJ SOLN: 4.0000 mg | INTRAMUSCULAR | Status: DC | PRN

## 2023-02-20 MED ORDER — TERBUTALINE SULFATE 1 MG/ML IJ SOLN: 0.2500 mg | Freq: Once | INTRAMUSCULAR | Status: DC | PRN

## 2023-02-20 MED ORDER — CEFAZOLIN SODIUM-DEXTROSE 2-4 GM/100ML-% IV SOLN
2.0000 g | Freq: Once | INTRAVENOUS | Status: AC
  Administered 2023-02-20: 2 g via INTRAVENOUS
  Filled 2023-02-20: qty 100

## 2023-02-20 MED ORDER — SENNOSIDES-DOCUSATE SODIUM 8.6-50 MG PO TABS
2.0000 | ORAL_TABLET | Freq: Every day | ORAL | Status: DC
  Administered 2023-02-21: 2 via ORAL
  Filled 2023-02-20: qty 2

## 2023-02-20 MED ORDER — IBUPROFEN 600 MG PO TABS
600.0000 mg | ORAL_TABLET | Freq: Four times a day (QID) | ORAL | Status: DC
  Administered 2023-02-20 – 2023-02-21 (×3): 600 mg via ORAL
  Filled 2023-02-20 (×4): qty 1

## 2023-02-20 MED ORDER — METHYLERGONOVINE MALEATE 0.2 MG/ML IJ SOLN: 0.2000 mg | INTRAMUSCULAR | Status: DC | PRN

## 2023-02-20 MED ORDER — SOD CITRATE-CITRIC ACID 500-334 MG/5ML PO SOLN: 30.0000 mL | ORAL | Status: DC | PRN

## 2023-02-20 MED ORDER — FENTANYL-BUPIVACAINE-NACL 0.5-0.125-0.9 MG/250ML-% EP SOLN
12.0000 mL/h | EPIDURAL | Status: DC | PRN
  Administered 2023-02-20: 12 mL/h via EPIDURAL
  Filled 2023-02-20: qty 250

## 2023-02-20 MED ORDER — ZOLPIDEM TARTRATE 5 MG PO TABS: 5.0000 mg | ORAL_TABLET | Freq: Every evening | ORAL | Status: DC | PRN

## 2023-02-20 MED ORDER — BENZOCAINE-MENTHOL 20-0.5 % EX AERO
1.0000 | INHALATION_SPRAY | CUTANEOUS | Status: DC | PRN
  Administered 2023-02-21: 1 via TOPICAL
  Filled 2023-02-20 (×2): qty 56

## 2023-02-20 MED ORDER — ACETAMINOPHEN 325 MG PO TABS: 650.0000 mg | ORAL_TABLET | ORAL | Status: DC | PRN

## 2023-02-20 MED ORDER — ONDANSETRON HCL 4 MG PO TABS: 4.0000 mg | ORAL_TABLET | ORAL | Status: DC | PRN

## 2023-02-20 MED ORDER — PRENATAL MULTIVITAMIN CH
1.0000 | ORAL_TABLET | Freq: Every day | ORAL | Status: DC
  Administered 2023-02-21: 1 via ORAL
  Filled 2023-02-20 (×2): qty 1

## 2023-02-20 MED ORDER — LACTATED RINGERS IV SOLN: 500.0000 mL | Freq: Once | INTRAVENOUS | Status: DC

## 2023-02-20 MED ORDER — DIBUCAINE (PERIANAL) 1 % EX OINT: 1.0000 | TOPICAL_OINTMENT | CUTANEOUS | Status: DC | PRN

## 2023-02-20 MED ORDER — OXYTOCIN-SODIUM CHLORIDE 30-0.9 UT/500ML-% IV SOLN
1.0000 m[IU]/min | INTRAVENOUS | Status: DC
  Administered 2023-02-20: 2 m[IU]/min via INTRAVENOUS
  Filled 2023-02-20: qty 500

## 2023-02-20 MED ORDER — DIPHENHYDRAMINE HCL 50 MG/ML IJ SOLN: 12.5000 mg | INTRAMUSCULAR | Status: DC | PRN

## 2023-02-20 MED ORDER — DIPHENHYDRAMINE HCL 25 MG PO CAPS: 25.0000 mg | ORAL_CAPSULE | Freq: Four times a day (QID) | ORAL | Status: DC | PRN

## 2023-02-20 MED ORDER — OXYTOCIN-SODIUM CHLORIDE 30-0.9 UT/500ML-% IV SOLN: 2.5000 [IU]/h | INTRAVENOUS | Status: DC

## 2023-02-20 MED ORDER — COCONUT OIL OIL: 1.0000 | TOPICAL_OIL | Status: DC | PRN

## 2023-02-20 MED ORDER — WITCH HAZEL-GLYCERIN EX PADS
1.0000 | MEDICATED_PAD | CUTANEOUS | Status: DC | PRN
  Administered 2023-02-21 (×2): 1 via TOPICAL

## 2023-02-20 MED ORDER — TETANUS-DIPHTH-ACELL PERTUSSIS 5-2.5-18.5 LF-MCG/0.5 IM SUSY: 0.5000 mL | PREFILLED_SYRINGE | Freq: Once | INTRAMUSCULAR | Status: DC

## 2023-02-20 MED ORDER — PHENYLEPHRINE 80 MCG/ML (10ML) SYRINGE FOR IV PUSH (FOR BLOOD PRESSURE SUPPORT): 80.0000 ug | PREFILLED_SYRINGE | INTRAVENOUS | Status: DC | PRN

## 2023-02-20 MED ORDER — ACETAMINOPHEN 325 MG PO TABS
650.0000 mg | ORAL_TABLET | ORAL | Status: DC | PRN
  Filled 2023-02-20: qty 2

## 2023-02-20 MED ORDER — METHYLERGONOVINE MALEATE 0.2 MG PO TABS: 0.2000 mg | ORAL_TABLET | ORAL | Status: DC | PRN

## 2023-02-20 MED ORDER — ONDANSETRON HCL 4 MG/2ML IJ SOLN: 4.0000 mg | Freq: Four times a day (QID) | INTRAMUSCULAR | Status: DC | PRN

## 2023-02-20 MED ORDER — LIDOCAINE HCL (PF) 1 % IJ SOLN
INTRAMUSCULAR | Status: DC | PRN
  Administered 2023-02-20 (×2): 5 mL via EPIDURAL

## 2023-02-20 MED ORDER — SIMETHICONE 80 MG PO CHEW: 80.0000 mg | CHEWABLE_TABLET | ORAL | Status: DC | PRN

## 2023-02-20 MED ORDER — LIDOCAINE HCL (PF) 1 % IJ SOLN: 30.0000 mL | INTRAMUSCULAR | Status: DC | PRN

## 2023-02-20 NOTE — Progress Notes (Signed)
Connie Lopez is a 42 y.o. [id number] at [redacted]w[redacted]d by LMP admitted for induction of labor due to Prior precipitous labor and AMA and adv dilatation.  Subjective: Comfortable with epidural  Objective: BP (!) 109/51   Pulse 76   Temp 98.8 F (37.1 C) (Oral)   Resp 16   Ht 5\' 7"  (1.702 m)   Wt 67.1 kg   SpO2 100%   BMI 23.17 kg/m  No intake/output data recorded. No intake/output data recorded.  FHT:  FHR: 245 bpm, variability: moderate,  accelerations:  Present,  decelerations:  Absent UC:   regular, every 3 minutes SVE:   Dilation: 5.5 Effacement (%): 100 Station: -1 Exam by:: Dr. Billy Coast AROM- clear Labs: Lab Results  Component Value Date   WBC 7.0 02/20/2023   HGB 13.6 02/20/2023   HCT 40.3 02/20/2023   MCV 95.5 02/20/2023   PLT 163 02/20/2023    Assessment / Plan: Induction of labor due to AMA,  progressing well on pitocin  Labor: Progressing normally Preeclampsia:  no signs or symptoms of toxicity Fetal Wellbeing:  Category I Pain Control:  Epidural I/D:  n/a Anticipated MOD:  NSVD  Lenoard Aden, MD 02/20/2023, 11:46 AM

## 2023-02-20 NOTE — Plan of Care (Signed)
  Problem: Education: Goal: Knowledge of General Education information will improve Description: Including pain rating scale, medication(s)/side effects and non-pharmacologic comfort measures Outcome: Progressing   Problem: Health Behavior/Discharge Planning: Goal: Ability to manage health-related needs will improve Outcome: Progressing   Problem: Clinical Measurements: Goal: Ability to maintain clinical measurements within normal limits will improve Outcome: Progressing Goal: Will remain free from infection Outcome: Progressing Goal: Diagnostic test results will improve Outcome: Progressing Goal: Respiratory complications will improve Outcome: Progressing Goal: Cardiovascular complication will be avoided Outcome: Progressing   Problem: Activity: Goal: Risk for activity intolerance will decrease Outcome: Progressing   Problem: Nutrition: Goal: Adequate nutrition will be maintained Outcome: Progressing   Problem: Coping: Goal: Level of anxiety will decrease Outcome: Progressing   Problem: Elimination: Goal: Will not experience complications related to bowel motility Outcome: Progressing Goal: Will not experience complications related to urinary retention Outcome: Progressing   Problem: Pain Managment: Goal: General experience of comfort will improve Outcome: Progressing   Problem: Safety: Goal: Ability to remain free from injury will improve Outcome: Progressing   Problem: Skin Integrity: Goal: Risk for impaired skin integrity will decrease Outcome: Progressing   Problem: Education: Goal: Knowledge of condition will improve Outcome: Progressing Goal: Individualized Educational Video(s) Outcome: Progressing Goal: Individualized Newborn Educational Video(s) Outcome: Progressing   Problem: Activity: Goal: Will verbalize the importance of balancing activity with adequate rest periods Outcome: Progressing Goal: Ability to tolerate increased activity will  improve Outcome: Progressing   Problem: Coping: Goal: Ability to identify and utilize available resources and services will improve Outcome: Progressing   Problem: Life Cycle: Goal: Chance of risk for complications during the postpartum period will decrease Outcome: Progressing   Problem: Role Relationship: Goal: Ability to demonstrate positive interaction with newborn will improve Outcome: Progressing   Problem: Skin Integrity: Goal: Demonstration of wound healing without infection will improve Outcome: Progressing   Problem: Education: Goal: Knowledge of condition will improve Outcome: Progressing Goal: Individualized Educational Video(s) Outcome: Progressing Goal: Individualized Newborn Educational Video(s) Outcome: Progressing   Problem: Activity: Goal: Will verbalize the importance of balancing activity with adequate rest periods Outcome: Progressing Goal: Ability to tolerate increased activity will improve Outcome: Progressing   Problem: Coping: Goal: Ability to identify and utilize available resources and services will improve Outcome: Progressing   Problem: Life Cycle: Goal: Chance of risk for complications during the postpartum period will decrease Outcome: Progressing   Problem: Role Relationship: Goal: Ability to demonstrate positive interaction with newborn will improve Outcome: Progressing   Problem: Skin Integrity: Goal: Demonstration of wound healing without infection will improve Outcome: Progressing   

## 2023-02-20 NOTE — Lactation Note (Signed)
This note was copied from a baby's chart. Lactation Consultation Note  Patient Name: Boy Darshae Munion [id number] Date: 02/20/2023 Age:41 hours Reason for consult: Initial assessment;Term (See Birth Parent's-MR, AMA) P3, term female infant, Per Birth Parent, BF is going well and infant is feeding 15 minutes when latching, infant recently BF at 1930 pm for 15 minutes. Birth Parent does not have any questions or concerns for LC at this time. Birth Parent will continue to BF infant by cues, every 2-3 hours within 24 hours, skin to skin. Birth Parent is experienced with breastfeeding see maternal data below. LC placed Birth Parent in PRN status. Birth Parent was made aware of O/P services, breastfeeding support groups, community resources, and our phone # for post-discharge questions.    Maternal Data Has patient been taught Hand Expression?: Yes Does the patient have breastfeeding experience prior to this delivery?: Yes How long did the patient breastfeed?: Per Birth Parent, she BF 1st and 2nd child for 2 years each.  Feeding Mother's Current Feeding Choice: Breast Milk  LATCH Score  Infant recently BF prior to [facility] entering the room.                  Lactation Tools Discussed/Used    Interventions Interventions: Breast feeding basics reviewed;Skin to skin;Position options;Education;LC Services brochure  Discharge Pump: DEBP;Personal (Per Birth Parent, she had Spectra DEBP at home.)  Consult Status Consult Status: PRN    Frederico Hamman 02/20/2023, 9:04 PM

## 2023-02-20 NOTE — Anesthesia Procedure Notes (Signed)
Epidural Patient location during procedure: OB Start time: 02/20/2023 10:32 AM End time: 02/20/2023 10:40 AM  Staffing Anesthesiologist: Mal Amabile, MD  Preanesthetic Checklist Completed: patient identified, IV checked, site marked, risks and benefits discussed, surgical consent, monitors and equipment checked, pre-op evaluation and timeout performed  Epidural Patient position: sitting Prep: DuraPrep and site prepped and draped Patient monitoring: continuous pulse ox and blood pressure Approach: midline Location: L3-L4 Injection technique: LOR air  Needle:  Needle type: Tuohy  Needle gauge: 17 G Needle length: 9 cm and 9 Needle insertion depth: 4 cm Catheter type: closed end flexible Catheter size: 19 Gauge Catheter at skin depth: 9 cm Test dose: negative and Other  Assessment Events: blood not aspirated, no cerebrospinal fluid, injection not painful, no injection resistance, no paresthesia and negative IV test  Additional Notes Patient identified. Risks and benefits discussed including failed block, incomplete  Pain control, post dural puncture headache, nerve damage, paralysis, blood pressure Changes, nausea, vomiting, reactions to medications-both toxic and allergic and post Partum back pain. All questions were answered. Patient expressed understanding and wished to proceed. Sterile technique was used throughout procedure. Epidural site was Dressed with sterile barrier dressing. No paresthesias, signs of intravascular injection Or signs of intrathecal spread were encountered.  Patient was more comfortable after the epidural was dosed. Please see RN's note for documentation of vital signs and FHR which are stable.  Reason for block:procedure for pain

## 2023-02-20 NOTE — H&P (Signed)
Connie Lopez is a 42 y.o. female presenting for IOL for AMA >40 and advanced dilatation. OB History     Gravida  6   Para  2   Term  2   Preterm      AB  3   Living  2      SAB  3   IAB      Ectopic      Multiple  0   Live Births  2          Past Medical History:  Diagnosis Date   Migraines    Past Surgical History:  Procedure Laterality Date   DILATION AND CURETTAGE OF UTERUS     LASIK     Lower GI     TONSILLECTOMY     TONSILLECTOMY AND ADENOIDECTOMY     UPPER GI ENDOSCOPY     WISDOM TOOTH EXTRACTION     Family History: family history includes Colon cancer in her maternal grandmother; Hypertension in her maternal grandfather and maternal grandmother; Lung cancer in her paternal grandfather. Social History:  reports that she has never smoked. She has never used smokeless tobacco. She reports that she does not drink alcohol and does not use drugs.     Maternal Diabetes: No Genetic Screening: Normal Maternal Ultrasounds/Referrals: Normal Fetal Ultrasounds or other Referrals:  None Maternal Substance Abuse:  No Significant Maternal Medications:  None Significant Maternal Lab Results:  Group B Strep negative Number of Prenatal Visits:greater than 3 verified prenatal visits Other Comments:  None  Review of Systems  Constitutional: Negative.   All other systems reviewed and are negative.  Maternal Medical History:  Reason for admission: Contractions.   Contractions: Frequency: rare.   Perceived severity is mild.   Fetal activity: Perceived fetal activity is normal.   Last perceived fetal movement was within the past hour.   Prenatal complications: Preterm labor.   Prenatal Complications - Diabetes: none.   Dilation: 4.5 Effacement (%): 80 Station: -2 Exam by:: CDerrill Kay, RNC Blood pressure 130/64, pulse 84, resp. rate 16, height 5\' 7"  (1.702 m), weight 67.1 kg, unknown if currently breastfeeding. Maternal Exam:  Uterine Assessment:  Contraction strength is mild.  Contraction frequency is irregular.  Abdomen: Patient reports no abdominal tenderness. Fetal presentation: vertex Introitus: Normal vulva. Normal vagina.  Ferning test: not done.  Nitrazine test: not done. Pelvis: adequate for delivery.   Cervix: Cervix evaluated by digital exam.     Physical Exam Vitals and nursing note reviewed.  Constitutional:      Appearance: Normal appearance. She is normal weight.  HENT:     Head: Normocephalic and atraumatic.  Cardiovascular:     Rate and Rhythm: Normal rate and regular rhythm.     Pulses: Normal pulses.     Heart sounds: Normal heart sounds.  Pulmonary:     Effort: Pulmonary effort is normal.     Breath sounds: Normal breath sounds.  Abdominal:     General: Bowel sounds are normal.     Palpations: Abdomen is soft.  Genitourinary:    General: Normal vulva.  Musculoskeletal:        General: Normal range of motion.     Cervical back: Normal range of motion and neck supple.  Skin:    General: Skin is warm and dry.  Neurological:     General: No focal deficit present.     Mental Status: She is alert and oriented to person, place, and time.  Psychiatric:  Mood and Affect: Mood normal.        Behavior: Behavior normal.     Prenatal labs: ABO, Rh: --/--/PENDING (09/09 2956) Antibody: PENDING (09/09 2130) Rubella: Immune (02/20 0000) RPR: Nonreactive (02/20 0000)  HBsAg: Negative (02/20 0000)  HIV: Non-reactive (02/20 0000)  GBS: Negative/-- (08/14 0000)   Assessment/Plan: 39+ wk IUP AMA >40 Advanced dilatation IOL   Zeke Aker J 02/20/2023, 7:48 AM

## 2023-02-20 NOTE — Anesthesia Preprocedure Evaluation (Signed)
Anesthesia Evaluation  Patient identified by MRN, date of birth, ID band Patient awake    Reviewed: Allergy & Precautions, H&P , Patient's Chart, lab work & pertinent test results  Airway Mallampati: II       Dental  (+) Teeth Intact   Pulmonary neg pulmonary ROS   Pulmonary exam normal breath sounds clear to auscultation       Cardiovascular Normal cardiovascular exam     Neuro/Psych  Headaches  negative psych ROS   GI/Hepatic Neg liver ROS,GERD  ,,  Endo/Other  negative endocrine ROS    Renal/GU negative Renal ROS     Musculoskeletal negative musculoskeletal ROS (+)    Abdominal   Peds  Hematology negative hematology ROS (+)   Anesthesia Other Findings   Reproductive/Obstetrics (+) Pregnancy                             Anesthesia Physical Anesthesia Plan  ASA: 2  Anesthesia Plan: Epidural   Post-op Pain Management:    Induction:   PONV Risk Score and Plan:   Airway Management Planned: Natural Airway  Additional Equipment:   Intra-op Plan:   Post-operative Plan:   Informed Consent: I have reviewed the patients History and Physical, chart, labs and discussed the procedure including the risks, benefits and alternatives for the proposed anesthesia with the patient or authorized representative who has indicated his/her understanding and acceptance.       Plan Discussed with: Anesthesiologist  Anesthesia Plan Comments:        Anesthesia Quick Evaluation

## 2023-02-21 LAB — CBC
HCT: 34.3 % — ABNORMAL LOW (ref 36.0–46.0)
Hemoglobin: 11.5 g/dL — ABNORMAL LOW (ref 12.0–15.0)
MCH: 33.1 pg (ref 26.0–34.0)
MCHC: 33.5 g/dL (ref 30.0–36.0)
MCV: 98.8 fL (ref 80.0–100.0)
Platelets: 128 10*3/uL — ABNORMAL LOW (ref 150–400)
RBC: 3.47 MIL/uL — ABNORMAL LOW (ref 3.87–5.11)
RDW: 14.4 % (ref 11.5–15.5)
WBC: 8.3 10*3/uL (ref 4.0–10.5)
nRBC: 0 % (ref 0.0–0.2)

## 2023-02-21 NOTE — Anesthesia Postprocedure Evaluation (Signed)
Anesthesia Post Note  Patient: Jing Ayre  Procedure(s) Performed: AN AD HOC LABOR EPIDURAL     Patient location during evaluation: Mother Baby Anesthesia Type: Epidural Level of consciousness: awake and alert Pain management: pain level controlled Vital Signs Assessment: post-procedure vital signs reviewed and stable Respiratory status: spontaneous breathing, nonlabored ventilation and respiratory function stable Cardiovascular status: stable Postop Assessment: no headache, no backache and epidural receding Anesthetic complications: no   No notable events documented.  Last Vitals:  Vitals:   02/21/23 0236 02/21/23 0539  BP: 98/69 103/60  Pulse: 66 67  Resp: 16 16  Temp: 36.6 C 36.6 C  SpO2: 100% 100%    Last Pain:  Vitals:   02/21/23 0740  TempSrc:   PainSc: 2    Pain Goal:                Epidural/Spinal Function Cutaneous sensation: Normal sensation (02/21/23 0740), Patient able to flex knees: Yes (02/21/23 0740), Patient able to lift hips off bed: Yes (02/21/23 0740), Back pain beyond tenderness at insertion site: No (02/21/23 0740), Progressively worsening motor and/or sensory loss: No (02/21/23 0740), Bowel and/or bladder incontinence post epidural: No (02/21/23 0740)  Juwann Sherk

## 2023-02-21 NOTE — Discharge Summary (Addendum)
OB Discharge Summary  Patient Name: Connie Lopez DOB: August 25, 1980 MRN: [id number]  Date of admission: 02/20/2023 Delivering provider: Olivia Mackie  Admitting diagnosis: Encounter for induction of labor [Z34.90] Intrauterine pregnancy: [redacted]w[redacted]d     Secondary diagnosis: Patient Active Problem List   Diagnosis Date Noted   Second degree perineal laceration 02/20/2023   Encounter for induction of labor 04/01/2020   SVD (spontaneous vaginal delivery) 10/20 04/01/2020   Postpartum care following vaginal delivery 9/9 04/01/2020     Date of discharge: 02/21/2023   Discharge diagnosis: Principal Problem:   Postpartum care following vaginal delivery 9/9 Active Problems:   Encounter for induction of labor   SVD (spontaneous vaginal delivery) 10/20   Second degree perineal laceration                                                              Augmentation: AROM and Pitocin Pain control: Epidural Laceration:2nd degree;Perineal Complications: None  Hospital course:  Induction of Labor With Vaginal Delivery   42 y.o. yo [phone] at [redacted]w[redacted]d was admitted to the hospital 02/20/2023 for induction of labor.  Indication for induction: AMA.  Patient had an labor course uncomplicated. Membrane Rupture Time/Date: 11:05 AM,02/20/2023  Delivery Method:Vaginal, Spontaneous Episiotomy: None Lacerations:  2nd degree;Perineal Details of delivery can be found in separate delivery note.  Patient had a postpartum course uncomplicated. Patient is discharged home 02/21/23.  Newborn Data: Birth date:02/20/2023 Birth time:12:24 PM Gender:Female Living status:Living Apgars:9 ,9  Weight:3250 g  Physical exam  Vitals:   02/20/23 1837 02/20/23 2226 02/21/23 0236 02/21/23 0539  BP: 115/66 (!) 106/56 98/69 103/60  Pulse: 73 73 66 67  Resp: 18 16 16 16   Temp: 97.9 F (36.6 C) 97.8 F (36.6 C) 97.8 F (36.6 C) 97.8 F (36.6 C)  TempSrc: Oral Oral Oral Oral  SpO2: 100% 98% 100% 100%  Weight:      Height:        General: alert and cooperative Lochia: appropriate Uterine Fundus: firm Incision: Healing well with no significant drainage Perineum: repair 2nd degree, no edema DVT Evaluation: No evidence of DVT seen on physical exam.  Labs: Lab Results  Component Value Date   WBC 8.3 02/21/2023   HGB 11.5 (L) 02/21/2023   HCT 34.3 (L) 02/21/2023   MCV 98.8 02/21/2023   PLT 128 (L) 02/21/2023      02/20/2023    2:05 PM 04/01/2020    1:49 PM 07/08/2017    9:00 AM  Edinburgh Postnatal Depression Scale Screening Tool  I have been able to laugh and see the funny side of things. 0 0 0  I have looked forward with enjoyment to things. 0 0 0  I have blamed myself unnecessarily when things went wrong. 0 0 0  I have been anxious or worried for no good reason. 0 0 0  I have felt scared or panicky for no good reason. 0 0 1  Things have been getting on top of me. 0 0 0  I have been so unhappy that I have had difficulty sleeping. 0 0 0  I have felt sad or miserable. 0 0 0  I have been so unhappy that I have been crying. 0 0 0  The thought of harming myself has occurred to me. 0  0 0  Edinburgh Postnatal Depression Scale Total 0 0 1   Discharge instructions:  per After Visit Summary  After Visit Meds:  Allergies as of 02/21/2023       Reactions   Penicillins Rash   Childhood reaction Has patient had a PCN reaction causing immediate rash, facial/tongue/throat swelling, SOB or lightheadedness with hypotension: Yes Has patient had a PCN reaction causing severe rash involving mucus membranes or skin necrosis: No Has patient had a PCN reaction that required hospitalization: No Has patient had a PCN reaction occurring within the last 10 years: No If all of the above answers are "NO", then may proceed with Cephalosporin use.      Activity: Advance as tolerated. Pelvic rest for 6 weeks.   Newborn Data: Live born female  Birth Weight: 7 lb 2.6 oz (3250 g) APGAR: 9, 9  Newborn Delivery   Birth  date/time: 02/20/2023 12:24:00 Delivery type: Vaginal, Spontaneous    Named Homero Fellers  Baby Feeding: Breast Circumcision: Completed Disposition:home with mother  Delivery Report:   Review the Delivery Report for details.    Follow up:  Follow-up Information     Olivia Mackie, MD. Schedule an appointment as soon as possible for a visit in 6 week(s).   Specialty: Obstetrics and Gynecology Contact information: [address] [place] [address] [phone]                June Leap, CNM 02/21/2023, 10:55 AM    Medical screening examination/treatment/procedure(s) were conducted as a shared visit with non-physician practitioner(s) and myself.  I personally evaluated the patient during the encounter.   June Leap, CNM, MSN 02/21/2023, 10:56 AM

## 2023-02-28 DIAGNOSIS — D2261 Melanocytic nevi of right upper limb, including shoulder: Secondary | ICD-10-CM | POA: Diagnosis not present

## 2023-02-28 DIAGNOSIS — L821 Other seborrheic keratosis: Secondary | ICD-10-CM | POA: Diagnosis not present

## 2023-02-28 DIAGNOSIS — D2271 Melanocytic nevi of right lower limb, including hip: Secondary | ICD-10-CM | POA: Diagnosis not present

## 2023-02-28 DIAGNOSIS — D2262 Melanocytic nevi of left upper limb, including shoulder: Secondary | ICD-10-CM | POA: Diagnosis not present

## 2023-03-22 ENCOUNTER — Telehealth (HOSPITAL_COMMUNITY): Payer: Self-pay | Admitting: *Deleted

## 2023-03-22 NOTE — Telephone Encounter (Signed)
03/22/2023  Name: Deltha Bernales MRN: [id number] DOB: January 09, 1981  Reason for Call:  Transition of Care Hospital Discharge Call  Contact Status: Patient Contact Status: Complete  Language assistant needed: Interpreter Mode: Interpreter Not Needed        Follow-Up Questions: Do You Have Any Concerns About Your Health As You Heal From Delivery?: No Do You Have Any Concerns About Your Infants Health?: No  Edinburgh Postnatal Depression Scale:  In the Past 7 Days:    PHQ2-9 Depression Scale:     Discharge Follow-up: Edinburgh score requires follow up?:  (declines screening today, just completed screening with peds office-low score, says she is doing well emotionally) Patient was advised of the following resources:: Support Group, Breastfeeding Support Group (declines postpartum group information via email)  Post-discharge interventions: Reviewed Newborn Safe Sleep Practices  Salena Saner, RN 03/22/2023 16:53

## 2023-04-17 ENCOUNTER — Ambulatory Visit (HOSPITAL_COMMUNITY): Payer: BC Managed Care – PPO

## 2023-04-17 ENCOUNTER — Encounter (HOSPITAL_COMMUNITY): Payer: Self-pay

## 2023-04-17 ENCOUNTER — Ambulatory Visit (HOSPITAL_COMMUNITY)
Admission: EM | Admit: 2023-04-17 | Discharge: 2023-04-17 | Disposition: A | Discharge status: Home / Self Care | Payer: BC Managed Care – PPO | Attending: Internal Medicine | Admitting: Internal Medicine

## 2023-04-17 DIAGNOSIS — M438X4 Other specified deforming dorsopathies, thoracic region: Secondary | ICD-10-CM | POA: Diagnosis not present

## 2023-04-17 DIAGNOSIS — M546 Pain in thoracic spine: Secondary | ICD-10-CM | POA: Diagnosis not present

## 2023-04-17 DIAGNOSIS — S22060A Wedge compression fracture of T7-T8 vertebra, initial encounter for closed fracture: Secondary | ICD-10-CM

## 2023-04-17 DIAGNOSIS — M4854XA Collapsed vertebra, not elsewhere classified, thoracic region, initial encounter for fracture: Secondary | ICD-10-CM | POA: Diagnosis not present

## 2023-04-17 DIAGNOSIS — S22068A Other fracture of T7-T8 thoracic vertebra, initial encounter for closed fracture: Secondary | ICD-10-CM

## 2023-04-17 NOTE — Discharge Instructions (Signed)
X-ray shows compression fracture of your spine You may take ibuprofen as needed for pain Gentle stretching exercises if pain improves Please follow-up with orthopedic surgery to be evaluated further Please feel free to return to urgent care if you have any other concerns.

## 2023-04-17 NOTE — ED Triage Notes (Addendum)
Pt presents with mid-back pain this morning after "I physically picked up my husband around 6:45 AM. I heard a crunch and I became worried." Pt denies taking any medications for 4/10 pain reported.

## 2023-04-19 NOTE — ED Provider Notes (Signed)
[facility]    CSN: [id number] Arrival date & time: 04/17/23  4098      History   Chief Complaint Chief Complaint  Patient presents with   Back Pain    HPI Connie Lopez is a 42 y.o. female with a history of osteopenia comes to urgent care with sudden onset severe back pain which started after she lifted her husband.  She had a popping sound when she lifted her husband and the pain was almost instantaneous.  Pain is in the upper mid back, sharp and aggravated by movement.  Pain is currently severe, 10/10.  No known relieving factors.  It is associated with some tightness in her upper back.  No nausea or vomiting.  No lower extremity weakness or numbness or tingling.  Patient denies any shortness of breath.   HPI  Past Medical History:  Diagnosis Date   Migraines     Patient Active Problem List   Diagnosis Date Noted   Second degree perineal laceration 02/20/2023   Encounter for induction of labor 04/01/2020   SVD (spontaneous vaginal delivery) 10/20 04/01/2020   Postpartum care following vaginal delivery 9/9 04/01/2020    Past Surgical History:  Procedure Laterality Date   DILATION AND CURETTAGE OF UTERUS     LASIK     Lower GI     TONSILLECTOMY     TONSILLECTOMY AND ADENOIDECTOMY     UPPER GI ENDOSCOPY     WISDOM TOOTH EXTRACTION      OB History     Gravida  6   Para  3   Term  3   Preterm      AB  3   Living  3      SAB  3   IAB      Ectopic      Multiple  0   Live Births  3            Home Medications    Prior to Admission medications   Medication Sig Start Date End Date Taking? Authorizing Provider  Cholecalciferol (VITAMIN D3) 50 MCG (2000 UT) capsule     [provider]  Prenatal Multivit-Min-Fe-FA (PRENATAL VITAMINS PO) Take 1 tablet by mouth daily.     [provider]    Family History Family History  Problem Relation Age of Onset   Colon cancer Maternal Grandmother    Hypertension Maternal  Grandmother    Hypertension Maternal Grandfather    Lung cancer Paternal Grandfather     Social History Social History   Tobacco Use   Smoking status: Never   Smokeless tobacco: Never  Vaping Use   Vaping status: Never Used  Substance Use Topics   Alcohol use: No    Comment: Social prior to preg   Drug use: No     Allergies   Penicillins   Review of Systems Review of Systems As per HPI  Physical Exam Triage Vital Signs ED Triage Vitals  Encounter Vitals Group     BP 04/17/23 0833 124/83     Systolic BP Percentile --      Diastolic BP Percentile --      Pulse Rate 04/17/23 0833 86     Resp 04/17/23 0833 18     Temp 04/17/23 0833 97.6 F (36.4 C)     Temp Source 04/17/23 0833 Oral     SpO2 04/17/23 0833 98 %     Weight --      Height --  Head Circumference --      Peak Flow --      Pain Score 04/17/23 0835 4     Pain Loc --      Pain Education --      Exclude from Growth Chart --    No data found.  Updated Vital Signs BP 124/83 (BP Location: Left Arm)   Pulse 86   Temp 97.6 F (36.4 C) (Oral)   Resp 18   SpO2 98%   Breastfeeding Yes   Visual Acuity Right Eye Distance:   Left Eye Distance:   Bilateral Distance:    Right Eye Near:   Left Eye Near:    Bilateral Near:     Physical Exam Vitals and nursing note reviewed.  Constitutional:      General: She is in acute distress.     Appearance: She is not ill-appearing.  Cardiovascular:     Rate and Rhythm: Normal rate and regular rhythm.     Pulses: Normal pulses.     Heart sounds: Normal heart sounds.  Pulmonary:     Effort: Pulmonary effort is normal.     Breath sounds: Normal breath sounds.  Musculoskeletal:        General: Normal range of motion.     Comments: Tenderness on percussion over the thoracic spine  Skin:    General: Skin is warm.  Neurological:     General: No focal deficit present.     Mental Status: She is alert and oriented to person, place, and time.     Cranial  Nerves: No cranial nerve deficit.     Sensory: No sensory deficit.     Motor: No weakness.     Gait: Gait normal.     Deep Tendon Reflexes: Reflexes normal.      UC Treatments / Results  Labs (all labs ordered are listed, but only abnormal results are displayed) Labs Reviewed - No data to display  EKG   Radiology No results found.  Procedures Procedures (including critical care time)  Medications Ordered in UC Medications - No data to display  Initial Impression / Assessment and Plan / UC Course  I have reviewed the triage vital signs and the nursing notes.  Pertinent labs & imaging results that were available during my care of the patient were reviewed by me and considered in my medical decision making (see chart for details).     1.  Compression fracture of T8: X-ray of the thoracic spine is remarkable for T8 compression fracture Patient is advised to follow-up with orthopedic surgery Ibuprofen or Tylenol as needed for pain Gentle stretching exercises when the pain improves Return precautions given Patient's neurologic status is intact.  There is no focal neurologic deficits. Final Clinical Impressions(s) / UC Diagnoses   Final diagnoses:  Traumatic compression fracture of T8 thoracic vertebra, closed, initial encounter [facility])     Discharge Instructions      X-ray shows compression fracture of your spine You may take ibuprofen as needed for pain Gentle stretching exercises if pain improves Please follow-up with orthopedic surgery to be evaluated further Please feel free to return to urgent care if you have any other concerns.   ED Prescriptions   None    PDMP not reviewed this encounter.   Merrilee Jansky, MD 04/19/23 [phone]

## 2023-04-21 DIAGNOSIS — M5416 Radiculopathy, lumbar region: Secondary | ICD-10-CM | POA: Diagnosis not present

## 2023-04-21 DIAGNOSIS — M546 Pain in thoracic spine: Secondary | ICD-10-CM | POA: Diagnosis not present

## 2023-04-22 DIAGNOSIS — M546 Pain in thoracic spine: Secondary | ICD-10-CM | POA: Diagnosis not present

## 2023-04-24 DIAGNOSIS — M546 Pain in thoracic spine: Secondary | ICD-10-CM | POA: Diagnosis not present

## 2023-04-24 DIAGNOSIS — M5416 Radiculopathy, lumbar region: Secondary | ICD-10-CM | POA: Diagnosis not present

## 2023-04-25 DIAGNOSIS — M546 Pain in thoracic spine: Secondary | ICD-10-CM | POA: Diagnosis not present

## 2023-05-05 DIAGNOSIS — N958 Other specified menopausal and perimenopausal disorders: Secondary | ICD-10-CM | POA: Diagnosis not present

## 2023-05-05 DIAGNOSIS — Z8262 Family history of osteoporosis: Secondary | ICD-10-CM | POA: Diagnosis not present

## 2023-05-05 DIAGNOSIS — E559 Vitamin D deficiency, unspecified: Secondary | ICD-10-CM | POA: Diagnosis not present

## 2023-05-05 DIAGNOSIS — M810 Age-related osteoporosis without current pathological fracture: Secondary | ICD-10-CM | POA: Diagnosis not present

## 2023-05-05 DIAGNOSIS — R5383 Other fatigue: Secondary | ICD-10-CM | POA: Diagnosis not present

## 2023-05-05 DIAGNOSIS — M8588 Other specified disorders of bone density and structure, other site: Secondary | ICD-10-CM | POA: Diagnosis not present

## 2023-05-29 DIAGNOSIS — M546 Pain in thoracic spine: Secondary | ICD-10-CM | POA: Diagnosis not present

## 2023-06-01 DIAGNOSIS — M810 Age-related osteoporosis without current pathological fracture: Secondary | ICD-10-CM | POA: Diagnosis not present

## 2023-06-01 DIAGNOSIS — J189 Pneumonia, unspecified organism: Secondary | ICD-10-CM | POA: Diagnosis not present

## 2023-06-01 DIAGNOSIS — R051 Acute cough: Secondary | ICD-10-CM | POA: Diagnosis not present

## 2023-07-10 DIAGNOSIS — M545 Low back pain, unspecified: Secondary | ICD-10-CM | POA: Diagnosis not present

## 2023-07-10 DIAGNOSIS — M546 Pain in thoracic spine: Secondary | ICD-10-CM | POA: Diagnosis not present

## 2023-07-11 DIAGNOSIS — Z713 Dietary counseling and surveillance: Secondary | ICD-10-CM | POA: Diagnosis not present

## 2023-08-01 DIAGNOSIS — Z713 Dietary counseling and surveillance: Secondary | ICD-10-CM | POA: Diagnosis not present

## 2023-08-08 DIAGNOSIS — M810 Age-related osteoporosis without current pathological fracture: Secondary | ICD-10-CM | POA: Diagnosis not present

## 2023-08-16 DIAGNOSIS — M545 Low back pain, unspecified: Secondary | ICD-10-CM | POA: Diagnosis not present

## 2023-08-24 DIAGNOSIS — Z713 Dietary counseling and surveillance: Secondary | ICD-10-CM | POA: Diagnosis not present

## 2023-08-28 DIAGNOSIS — D225 Melanocytic nevi of trunk: Secondary | ICD-10-CM | POA: Diagnosis not present

## 2023-08-28 DIAGNOSIS — L570 Actinic keratosis: Secondary | ICD-10-CM | POA: Diagnosis not present

## 2023-09-04 DIAGNOSIS — M545 Low back pain, unspecified: Secondary | ICD-10-CM | POA: Diagnosis not present

## 2023-10-05 DIAGNOSIS — Z79899 Other long term (current) drug therapy: Secondary | ICD-10-CM | POA: Diagnosis not present

## 2023-10-05 DIAGNOSIS — M810 Age-related osteoporosis without current pathological fracture: Secondary | ICD-10-CM | POA: Diagnosis not present

## 2023-10-10 DIAGNOSIS — M546 Pain in thoracic spine: Secondary | ICD-10-CM | POA: Diagnosis not present

## 2023-10-12 DIAGNOSIS — R82998 Other abnormal findings in urine: Secondary | ICD-10-CM | POA: Diagnosis not present

## 2023-10-12 DIAGNOSIS — Z1331 Encounter for screening for depression: Secondary | ICD-10-CM | POA: Diagnosis not present

## 2023-10-12 DIAGNOSIS — Z1339 Encounter for screening examination for other mental health and behavioral disorders: Secondary | ICD-10-CM | POA: Diagnosis not present

## 2023-10-12 DIAGNOSIS — Z0000 Encounter for general adult medical examination without abnormal findings: Secondary | ICD-10-CM | POA: Diagnosis not present

## 2023-10-12 DIAGNOSIS — M810 Age-related osteoporosis without current pathological fracture: Secondary | ICD-10-CM | POA: Diagnosis not present

## 2023-10-21 DIAGNOSIS — M546 Pain in thoracic spine: Secondary | ICD-10-CM | POA: Diagnosis not present

## 2023-11-22 ENCOUNTER — Other Ambulatory Visit: Payer: Self-pay | Admitting: Internal Medicine

## 2023-11-22 DIAGNOSIS — R1011 Right upper quadrant pain: Secondary | ICD-10-CM

## 2023-12-04 ENCOUNTER — Ambulatory Visit
Admission: RE | Admit: 2023-12-04 | Discharge: 2023-12-04 | Disposition: A | Discharge status: Home / Self Care | Source: Ambulatory Visit | Attending: Internal Medicine | Admitting: Internal Medicine

## 2023-12-04 DIAGNOSIS — R1011 Right upper quadrant pain: Secondary | ICD-10-CM

## 2024-02-22 DIAGNOSIS — Z124 Encounter for screening for malignant neoplasm of cervix: Secondary | ICD-10-CM | POA: Diagnosis not present

## 2024-02-22 DIAGNOSIS — Z681 Body mass index (BMI) 19 or less, adult: Secondary | ICD-10-CM | POA: Diagnosis not present

## 2024-02-22 DIAGNOSIS — Z01419 Encounter for gynecological examination (general) (routine) without abnormal findings: Secondary | ICD-10-CM | POA: Diagnosis not present
